# Patient Record
Sex: Female | Born: 1970 | Race: White | Hispanic: No | Marital: Married | Smoking: Former smoker
Health system: Southern US, Community
[De-identification: ages and names within clinical notes are randomized; demographics above are authoritative.]

## PROBLEM LIST (undated history)

## (undated) ENCOUNTER — Ambulatory Visit (HOSPITAL_COMMUNITY)

## (undated) DIAGNOSIS — M2550 Pain in unspecified joint: Secondary | ICD-10-CM

## (undated) DIAGNOSIS — E0590 Thyrotoxicosis, unspecified without thyrotoxic crisis or storm: Secondary | ICD-10-CM

## (undated) DIAGNOSIS — T7840XA Allergy, unspecified, initial encounter: Secondary | ICD-10-CM

## (undated) DIAGNOSIS — M1990 Unspecified osteoarthritis, unspecified site: Secondary | ICD-10-CM

## (undated) DIAGNOSIS — E079 Disorder of thyroid, unspecified: Secondary | ICD-10-CM

## (undated) DIAGNOSIS — E0500 Thyrotoxicosis with diffuse goiter without thyrotoxic crisis or storm: Secondary | ICD-10-CM

## (undated) HISTORY — DX: Disorder of thyroid, unspecified: E07.9

## (undated) HISTORY — PX: OTHER SURGICAL HISTORY: SHX169

## (undated) HISTORY — PX: SHOULDER SURGERY: SHX246

## (undated) HISTORY — DX: Allergy, unspecified, initial encounter: T78.40XA

## (undated) HISTORY — DX: Thyrotoxicosis with diffuse goiter without thyrotoxic crisis or storm: E05.00

## (undated) HISTORY — DX: Unspecified osteoarthritis, unspecified site: M19.90

---

## 2010-05-03 LAB — HM MAMMOGRAPHY

## 2012-08-18 DIAGNOSIS — M5137 Other intervertebral disc degeneration, lumbosacral region: Secondary | ICD-10-CM | POA: Insufficient documentation

## 2016-04-25 DIAGNOSIS — Z0000 Encounter for general adult medical examination without abnormal findings: Secondary | ICD-10-CM

## 2016-04-26 ENCOUNTER — Inpatient Hospital Stay: Admit: 2016-04-26 | Payer: BLUE CROSS/BLUE SHIELD | Primary: Family Medicine

## 2016-04-26 LAB — METABOLIC PANEL, COMPREHENSIVE
ALT (SGPT): 25 U/L (ref 12–78)
AST (SGOT): 16 U/L (ref 15–37)
Albumin: 3.8 gm/dl (ref 3.4–5.0)
Alk. phosphatase: 63 U/L (ref 45–117)
BUN: 16 mg/dl (ref 7–25)
Bilirubin, total: 0.4 mg/dl (ref 0.2–1.0)
CO2: 22 mEq/L (ref 21–32)
Calcium: 8.9 mg/dl (ref 8.5–10.1)
Chloride: 106 mEq/L (ref 98–107)
Creatinine: 0.7 mg/dl (ref 0.6–1.3)
GFR est AA: >60
GFR est non-AA: >60
Glucose: 87 mg/dl (ref 74–106)
Potassium: 3.9 mEq/L (ref 3.5–5.1)
Protein, total: 6.7 gm/dl (ref 6.4–8.2)
Sodium: 138 mEq/L (ref 136–145)

## 2016-04-26 LAB — CBC WITH AUTOMATED DIFF
BASOPHILS: 0.5 % (ref 0–3)
EOSINOPHILS: 1.4 % (ref 0–5)
HCT: 40.4 % (ref 37.0–50.0)
HGB: 13.5 gm/dl (ref 13.0–17.2)
IMMATURE GRANULOCYTES: 0.2 % (ref 0.0–3.0)
LYMPHOCYTES: 21.4 % — ABNORMAL LOW (ref 28–48)
MCH: 29 pg (ref 25.4–34.6)
MCHC: 33.4 gm/dl (ref 30.0–36.0)
MCV: 86.7 fL (ref 80.0–98.0)
MONOCYTES: 8 % (ref 1–13)
MPV: 10.6 fL — ABNORMAL HIGH (ref 6.0–10.0)
NEUTROPHILS: 68.5 % — ABNORMAL HIGH (ref 34–64)
NRBC: 0 (ref 0–0)
PLATELET: 289 10*3/uL (ref 140–450)
RBC: 4.66 M/uL (ref 3.60–5.20)
RDW-SD: 38.8 (ref 36.4–46.3)
WBC: 6.5 10*3/uL (ref 4.0–11.0)

## 2016-04-26 LAB — LIPID PANEL
CHOL/HDL Ratio: 4.8 Ratio — ABNORMAL HIGH (ref 0.0–4.4)
Cholesterol, total: 190 mg/dl (ref 140–199)
HDL Cholesterol: 40 mg/dl (ref 40–96)
LDL, calculated: 126 mg/dl (ref 0–130)
Triglyceride: 122 mg/dl (ref 29–150)

## 2016-04-26 LAB — TSH 3RD GENERATION: TSH: 1.63 u[IU]/mL (ref 0.358–3.740)

## 2016-04-26 LAB — VITAMIN D, 25 HYDROXY: Vitamin D, 25-OH, Total: 58.3 ng/ml (ref 30.0–100.0)

## 2016-04-26 LAB — VITAMIN B12: Vitamin B12: 518 pg/ml (ref 193–986)

## 2016-04-26 LAB — T4, FREE: Free T4: 1.33 ng/dl (ref 0.76–1.46)

## 2016-05-12 ENCOUNTER — Inpatient Hospital Stay
Admit: 2016-05-12 | Discharge: 2016-05-13 | Disposition: A | Discharge status: Home / Self Care | Payer: BLUE CROSS/BLUE SHIELD | Attending: Emergency Medicine

## 2016-05-12 DIAGNOSIS — H1013 Acute atopic conjunctivitis, bilateral: Secondary | ICD-10-CM

## 2016-05-12 MED ORDER — FAMOTIDINE (PF) 20 MG/2 ML IV
20 mg/2 mL | Freq: Two times a day (BID) | INTRAVENOUS | Status: DC
Start: 2016-05-12 — End: 2016-05-13
  Administered 2016-05-13: 08:00:00 via INTRAVENOUS

## 2016-05-12 MED ORDER — METHYLPREDNISOLONE (PF) 125 MG/2 ML IJ SOLR
125 mg/2 mL | Freq: Four times a day (QID) | INTRAMUSCULAR | Status: DC
Start: 2016-05-12 — End: 2016-05-13
  Administered 2016-05-12 – 2016-05-13 (×5): via INTRAVENOUS

## 2016-05-12 MED ORDER — SODIUM CHLORIDE 0.9 % IJ SYRG
Freq: Once | INTRAMUSCULAR | Status: AC
Start: 2016-05-12 — End: 2016-05-12
  Administered 2016-05-12: 20:00:00 via INTRAVENOUS

## 2016-05-12 MED ORDER — SODIUM CHLORIDE 0.9 % IJ SYRG
INTRAMUSCULAR | Status: DC | PRN
Start: 2016-05-12 — End: 2016-05-13

## 2016-05-12 MED ORDER — PROPARACAINE 0.5 % EYE DROPS
0.5 % | OPHTHALMIC | Status: AC
Start: 2016-05-12 — End: 2016-05-12
  Administered 2016-05-12: 22:00:00

## 2016-05-12 MED ORDER — FAMOTIDINE (PF) 20 MG/2 ML IV
20 mg/2 mL | INTRAVENOUS | Status: AC
Start: 2016-05-12 — End: 2016-05-12
  Administered 2016-05-12: 20:00:00 via INTRAVENOUS

## 2016-05-12 MED ORDER — SODIUM CHLORIDE 0.9 % IJ SYRG
Freq: Three times a day (TID) | INTRAMUSCULAR | Status: DC
Start: 2016-05-12 — End: 2016-05-13
  Administered 2016-05-13: 02:00:00 via INTRAVENOUS

## 2016-05-12 MED ORDER — FAMOTIDINE (PF) 20 MG/2 ML IV
20 mg/2 mL | Freq: Two times a day (BID) | INTRAVENOUS | Status: DC
Start: 2016-05-12 — End: 2016-05-12

## 2016-05-12 MED ORDER — DIPHENHYDRAMINE HCL 50 MG/ML IJ SOLN
50 mg/mL | INTRAMUSCULAR | Status: AC
Start: 2016-05-12 — End: 2016-05-12
  Administered 2016-05-12: 20:00:00 via INTRAVENOUS

## 2016-05-12 MED ORDER — DIPHENHYDRAMINE HCL 50 MG/ML IJ SOLN
50 mg/mL | Freq: Four times a day (QID) | INTRAMUSCULAR | Status: DC
Start: 2016-05-12 — End: 2016-05-13
  Administered 2016-05-13 (×3): via INTRAVENOUS

## 2016-05-12 MED FILL — DIPHENHYDRAMINE HCL 50 MG/ML IJ SOLN: 50 mg/mL | INTRAMUSCULAR | Qty: 1

## 2016-05-12 MED FILL — SODIUM CHLORIDE 0.9 % INJECTION: INTRAMUSCULAR | Qty: 10

## 2016-05-12 MED FILL — SOLU-MEDROL (PF) 125 MG/2 ML SOLUTION FOR INJECTION: 125 mg/2 mL | INTRAMUSCULAR | Qty: 2

## 2016-05-12 MED FILL — PROPARACAINE 0.5 % EYE DROPS: 0.5 % | OPHTHALMIC | Qty: 15

## 2016-05-12 NOTE — ED Notes (Signed)
TRANSFER - OUT REPORT:    Verbal report given to Toniann FailWendy, Charity fundraiserN (name) on Wanda Marshall  being transferred to Ed Obs, bed 3 (unit) for routine progression of care       Report consisted of patient???s Situation, Background, Assessment and   Recommendations(SBAR).     Information from the following report(s) SBAR was reviewed with the receiving nurse.    Lines:   Peripheral IV 05/12/16 Right Antecubital (Active)   Site Assessment Clean, dry, & intact 05/12/2016  2:35 PM   Phlebitis Assessment 0 05/12/2016  2:35 PM   Infiltration Assessment 0 05/12/2016  2:35 PM   Dressing Status Clean, dry, & intact 05/12/2016  2:35 PM   Dressing Type Transparent 05/12/2016  2:35 PM   Hub Color/Line Status Pink;Flushed;Patent 05/12/2016  2:35 PM   Action Taken Blood drawn 05/12/2016  2:35 PM   Alcohol Cap Used Yes 05/12/2016  2:35 PM        Opportunity for questions and clarification was provided.      Patient transported with:   [employer]

## 2016-05-12 NOTE — ED Notes (Signed)
Bedside and Verbal shift change report given to Chassidy A Mobley, RN   (oncoming nurse) by Hollie RN  (offgoing nurse). Report included the following information SBAR.

## 2016-05-12 NOTE — ED Notes (Signed)
TRANSFER - OUT REPORT:    Verbal report given to [facility], RN on [employer]ley Baskett.       Report consisted of patient???s Situation, Background, Assessment and   Recommendations(SBAR).     Information from the following report(s) SBAR, Kardex, ED Summary, [facility] and Recent Results was reviewed with the receiving nurse.    Lines:   Peripheral IV 05/12/16 Right Antecubital (Active)   Site Assessment Clean, dry, & intact 05/12/2016  2:35 PM   Phlebitis Assessment 0 05/12/2016  2:35 PM   Infiltration Assessment 0 05/12/2016  2:35 PM   Dressing Status Clean, dry, & intact 05/12/2016  2:35 PM   Dressing Type Transparent 05/12/2016  2:35 PM   Hub Color/Line Status Pink;Flushed;Patent 05/12/2016  2:35 PM   Action Taken Blood drawn 05/12/2016  2:35 PM   Alcohol Cap Used Yes 05/12/2016  2:35 PM        Opportunity for questions and clarification was provided.

## 2016-05-12 NOTE — ED Progress Note (Signed)
Tonopen:  Left eye: 16 with 5% SE  Right eye: 19 with 5% SE    Slit lamp evaluation performed and showed no abnormalities. No fluoroscein uptake. Patient is feeling better after receiving Proparacaine. Will continue to monitor and reassess in am.    Patient Vitals for the past 24 hrs:   Temp Pulse Resp BP SpO2   05/12/16 1700 98 ??F (36.7 ??C) (!) 107 16 122/81 100 %   05/12/16 1315 98.2 ??F (36.8 ??C) (!) 106 15 124/82 99 %       Carmelina DaneKara M Emori Kamau, PA-C

## 2016-05-12 NOTE — ED Notes (Signed)
Dr. Thana AtesMcCormack consulting at pt's bedside

## 2016-05-12 NOTE — ED Notes (Signed)
This RN, dietary, and the patient discussed options for dinner tray that would accommodate the patient's allergies.  Patient received dinner tray as requested, tolerated without distress.  Patient was not able to drink cola beverage provided by this RN due to ingredients.  Patient requested directions to vending machine and was taken to vending machine and also shown to hospital cafeteria.

## 2016-05-12 NOTE — ED Triage Notes (Signed)
Pt sent to ED by doctor for allergy flair up, complains of increased eye watering, photosensitivity etc starting yesterday morning.  Pt has been taking flonase, singulair, claritin and benadryl with no relief.  Doctor gave her 120mg  solumedrol in office but it did not work like they needed it to.

## 2016-05-12 NOTE — ED Notes (Signed)
Report attempted and notified by [facility], RN that they just took report on two patients and she will call me back when they get caught up.  Will follow up

## 2016-05-12 NOTE — ED Notes (Signed)
Pt laying in bed with eyes closed and no distress noted at this time.  Will monitor.

## 2016-05-12 NOTE — ED Notes (Signed)
Patient requested to know if the linen in the hospital is washed in detergent containing soy.  This RN spoke to BromideRachel, [employer] Supervisor, who communicated with head of department responsible for linen.  The supervisor stated that the company that washes the hospital linen is closed at this time and the only thing they can be certain of regarding laundry detergent is that it does not contain bleach.  This information was provided to the patient.  The patient accepted a top sheet and blanket but is leaving it at the foot of her bed until she needs them.  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  The patient states if there is soy in the detergent she will get hives that cause a burning sensation to her skin.  At this time the patient is in no acute distress related to the linen, will continue to monitor.

## 2016-05-12 NOTE — ED Notes (Signed)
TRANSFER - IN REPORT:    Verbal report received from MontaquaMegan, RN on Oscar Lashley Montagna  being received from ED for routine progression of care      Report consisted of patient???s Situation, Background, Assessment and   Recommendations(SBAR).     Information from the following report(s) SBAR, Kardex, ED Summary, [facility] and Recent Results was reviewed with the receiving nurse.    Opportunity for questions and clarification was provided.      Assessment completed upon patient???s arrival to unit and care assumed.

## 2016-05-12 NOTE — ED Provider Notes (Signed)
[facility]  Emergency Department Treatment Report    Patient: Wanda Marshall Age: 45 y.o. Sex: female    Date of Birth: 1971/04/30 Admit Date: 05/12/2016 PCP: Frederico Hamman, NP   MRN: [id number]  CSN: [id number]     Room: 103/EO03 Time Dictated: 2:02 PM          Chief Commplaint   Allergies    History of Present Illness   45 y.o. female who was referred by her primary care physician for what she is describing as seasonal allergies.  States she has a flareup like this about once a year. She is having watery eyes, that are irritated, the light is bothering them but she doesn't have eye pain or headache.  She felt like her eyes were puffy, She was given "steroid injections" at her pcp x 3 in 40mg  increments it was Solu-Medrol according to records. She states that she is feeling a big difference after the first shot and is improving but did not improve much after the subsequent injections.  Her nose was running a lot.  She is having mild wheezing off and on.  No angioedema.  No new foods or medicines.  She has been dealing with this "for a lot of years." Has gotten allergy shots before. Had just moved here but is in the process of being referred to another allergist.    She is taking singulair, claritin, flonase.  She took benadryl last yesterday 3 pills over the course of the day.  She has never been hospitalized except for an asthma attack 3 years ago.  She has an epi pen but has not needed to use it recently  She has no rash associated  She reports that after she got the Solu-Medrol her eyes were less puffy and she was feeling much better.    Review of Systems   Review of Systems   Constitutional: Negative for fever.   HENT: Positive for congestion. Negative for sore throat.         "sinus pressure"   Eyes: Negative for blurred vision.        No loss of vision denies any eye pain or pain with movement   Respiratory: Positive for wheezing. Negative for cough and shortness of breath.     Cardiovascular: Negative for chest pain and leg swelling.   Gastrointestinal: Negative for abdominal pain, diarrhea and vomiting.   Genitourinary: Negative for dysuria.   Musculoskeletal: Negative for falls.   Skin: Negative for itching and rash.   Neurological: Negative for loss of consciousness and headaches.       Past Medical/Surgical History     Past Medical History:   Diagnosis Date   ??? Asthma with allergic rhinitis    ??? Environmental allergies      Past Surgical History:   Procedure Laterality Date   ??? HX PARTIAL HYSTERECTOMY     ??? HX SHOULDER ARTHROSCOPY Bilateral      PMH: allergies, asthma, she has an "enlarged heart" is referred cardiology was seen on a cxr.  Shoulder arthritis  PSH: partial hysterectomy, 3 shoulder surgeries    Social History     Social History     Social History   ??? Marital status: MARRIED     Spouse name: N/A   ??? Number of children: N/A   ??? Years of education: N/A     Social History Main Topics   ??? Smoking status: Never Smoker   ??? Smokeless tobacco: Never Used   ???  Alcohol use No   ??? Drug use: No   ??? Sexual activity: Not Asked     Other Topics Concern   ??? None     Social History Narrative   ??? None     SH: no tobacco    Family History     Family History   Problem Relation Age of Onset   ??? Family history unknown: Yes       Current Medications   flonase singular claritin benadryl    None     Allergies   Bupivacaine  Beans    Allergies   Allergen Reactions   ??? Septocaine [Articaine-Epinephrine] Anaphylaxis   ??? Decadron [Dexamethasone] Other (comments)     Pressure in ears, rash   ??? Elavil Rash   ??? Prednisone Other (comments)     "makes me want to kill people"  And a rash   ??? Propylene Glycol Rash   ??? Sesame Seed Rash   ??? Soy Rash     Blisters in mouth       Physical Exam   ED Triage Vitals   Enc Vitals Group      BP 05/12/16 1315 124/82      Pulse (Heart Rate) 05/12/16 1315 106      Resp Rate 05/12/16 1315 15      Temp 05/12/16 1315 98.2 ??F (36.8 ??C)      Temp src --        O2 Sat (%) 05/12/16 1315 99 %      Weight 05/12/16 1307 153 lb 2 oz      Height 05/12/16 1307 5' 3.25"      Head Cir --       Peak Flow --       Pain Score --       Pain Loc --       Pain Edu? --       Excl. in GC? --        Physical Exam   Constitutional: She is oriented to person, place, and time and well-developed, well-nourished, and in no distress. No distress.   HENT:   Head: Normocephalic and atraumatic.   Right Ear: External ear normal.   Left Ear: External ear normal.   Mouth/Throat: Oropharynx is clear and moist.   No angioedema  Nose has some congestion and rhinorrhea and indicates she has some maxillary sinus pressure   Eyes: EOM are normal. Pupils are equal, round, and reactive to light.   No pain with EOM  She has slightly puffy but not cellulitic upper lids  She has a slight injection to the left conjunctiva which otherwise looks okay  Right conjunctiva is normal   Neck: Neck supple.   Cardiovascular: Normal rate, regular rhythm and normal heart sounds.    Pulmonary/Chest: Effort normal and breath sounds normal. She has no wheezes.   Abdominal: Soft. There is no tenderness.   Musculoskeletal: Normal range of motion.   Neurological: She is alert and oriented to person, place, and time.   No facial droop   Skin: Skin is warm and dry.   Psychiatric: Affect normal.       Impression and Management Plan   Patient is here with the complaint of she states her seasonal allergies are bothering her. She has no wheezing or angioedema. She has no rash. She had been sent here by her primary care physician for IV steroids. She states that she had a lot of improvement with the steroids  and with her history of allergies and constellation of symptoms runny eyes and runny nose I suspect this is allergic in nature. I don't think this is any type of cellulitis and she doesn't have a headache.    I've asked Diannia RuderKara DeMott to do a slit lamp exam to r/o an abrasion or ulcer and check pressures    Diagnostic Studies      ED Course/Medical Decision Making   Patient did not develop any new symptoms I have spoken to pharmacist as to who advises we can proceed with IV Solu-Medrol 6 hours from time of last IM Solu-Medrol which would be about 5:15. I gave the patient IV Benadryl and Pepcid. She was sent here from her primary care physician for observation and continued steroids. So I placed her in ED observation for such. She'll continue to get IV Benadryl and Pepcid.  I will continue her Claritin Singulair and Flonase    Visual acuity bilateral 20/13 right 20/13 left 20/13  Patient denies any loss of vision or double vision or floaters or flashers  States she wears contacts but does not have them in today    Eye exam by  [place] CenterKara DeMott showed no ulcer, abrasion, uptake of flouroscein  She was dispo to ED OBS for continued eval and treatment      Medications   sodium chloride (NS) flush 5-10 mL (not administered)   sodium chloride (NS) flush 5-10 mL (not administered)   methylPREDNISolone (PF) (SOLU-MEDROL) injection 60 mg (60 mg IntraVENous Given 05/12/16 1707)   diphenhydrAMINE (BENADRYL) injection 25 mg (0 mg IntraVENous Held 05/12/16 1800)   famotidine (PF) (PEPCID) injection 20 mg (not administered)   fluticasone (FLONASE) 50 mcg/actuation nasal spray 2 Spray (not administered)   loratadine (CLARITIN) tablet 10 mg (not administered)   montelukast (SINGULAIR) tablet 10 mg (not administered)   sodium chloride (NS) flush 5-10 mL (10 mL IntraVENous Given 05/12/16 1540)   diphenhydrAMINE (BENADRYL) injection 25 mg (25 mg IntraVENous Given 05/12/16 1540)   famotidine (PF) (PEPCID) 20 mg in sodium chloride 0.9 % 10 mL injection (20 mg IntraVENous Given 05/12/16 1540)   proparacaine (OPTHAINE) 0.5 % ophthalmic solution (  Given by Provider 05/12/16 1800)       Final Diagnosis       ICD-10-CM ICD-9-CM   1. Allergic conjunctivitis, bilateral H10.13 372.14   2. Rhinorrhea J34.89 478.19       Disposition   ED OBS    Elveria RoyalsSara N Khambrel Amsden, MD  May 12, 2016     My signature above authenticates this document and my orders, the final ??  diagnosis (es), discharge prescription (s), and instructions in the Epic ??  record.  If you have any questions please contact [phone].  ??  Nursing notes have been reviewed by the physician/ advanced practice ??  Clinician.

## 2016-05-13 MED ORDER — METHYLPREDNISOLONE 4 MG TABS IN A DOSE PACK
4 mg | ORAL | 0 refills | Status: AC
Start: 2016-05-13 — End: ?

## 2016-05-13 MED ORDER — LORATADINE 10 MG TAB
10 mg | Freq: Every day | ORAL | Status: DC
Start: 2016-05-13 — End: 2016-05-13
  Administered 2016-05-13: 12:00:00 via ORAL

## 2016-05-13 MED ORDER — ERYTHROMYCIN 5 MG/G EYE OINTMENT
5 mg/gram (0. %) | OPHTHALMIC | 0 refills | Status: AC
Start: 2016-05-13 — End: 2016-05-20

## 2016-05-13 MED ORDER — FLUTICASONE 50 MCG/ACTUATION NASAL SPRAY, SUSP
50 mcg/actuation | Freq: Every day | NASAL | Status: DC
Start: 2016-05-13 — End: 2016-05-13
  Administered 2016-05-13: 12:00:00 via NASAL

## 2016-05-13 MED ORDER — MONTELUKAST 10 MG TAB
10 mg | Freq: Every day | ORAL | Status: DC
Start: 2016-05-13 — End: 2016-05-13
  Administered 2016-05-13: 15:00:00 via ORAL

## 2016-05-13 MED ORDER — SODIUM CHLORIDE 0.9 % IV
Freq: Once | INTRAVENOUS | Status: AC
Start: 2016-05-13 — End: 2016-05-13
  Administered 2016-05-13: 08:00:00 via INTRAVENOUS

## 2016-05-13 MED FILL — SODIUM CHLORIDE 0.9 % IV: INTRAVENOUS | Qty: 1000

## 2016-05-13 MED FILL — MONTELUKAST 10 MG TAB: 10 mg | ORAL | Qty: 1

## 2016-05-13 MED FILL — SOLU-MEDROL (PF) 125 MG/2 ML SOLUTION FOR INJECTION: 125 mg/2 mL | INTRAMUSCULAR | Qty: 2

## 2016-05-13 MED FILL — DIPHENHYDRAMINE HCL 50 MG/ML IJ SOLN: 50 mg/mL | INTRAMUSCULAR | Qty: 1

## 2016-05-13 MED FILL — FAMOTIDINE (PF) 20 MG/2 ML IV: 20 mg/2 mL | INTRAVENOUS | Qty: 2

## 2016-05-13 MED FILL — LORATADINE 10 MG TAB: 10 mg | ORAL | Qty: 1

## 2016-05-13 MED FILL — FLUTICASONE 50 MCG/ACTUATION NASAL SPRAY, SUSP: 50 mcg/actuation | NASAL | Qty: 16

## 2016-05-13 NOTE — Discharge Summary (Signed)
Discharge Summary by Julien GirtIcenbice, Anatole Apollo E, PA-C at 05/13/16 1018                Author: Julien GirtIcenbice, Taiten Brawn E, PA-C  Service: Emergency Medicine  Author Type: Physician Assistant       Filed: 05/13/16 1101  Date of Service: 05/13/16 1018  Status: Attested           Editor: Meribeth MattesIcenbice, Leah Thornberry E, PA-C (Physician Assistant)  Cosigner: Jerilynn SomHsu, Helen H, MD at 05/13/16 1659          Attestation signed by Jerilynn SomHsu, Helen H, MD at 05/13/16 1659          I, Dianna RossettiHelen Hsu MD, have discussed this case with the Advanced Practice Provider.  We have reviwed the presentation, pertinent historical and physical exam findings,  as well as relevant laboratory/radiology finding.  I have been available at all times and agree with the managment and disposition documented here.      Dianna RossettiHelen Hsu, MD                                 [facility]   Emergency ObservationDepartment    Discharge Summary          Patient: Wanda Marshall  Age: 45 y.o.  Sex: female          Date of Birth: 1971-09-07  Admit Date: 05/12/2016  PCP: Frederico HammanKathleen B Erezo, NP     MRN: [id number]   CSN: [id number]            Room: 103/EO03            Discharge Physician    Dr. Dianna RossettiHelen Hsu   Date & Time of Observation Admission:   May 12, 2016 4:15 PM   Date & Time of Observation Discharge:   May 13, 2016 10:18 AM        History of Present Illness     45 y.o. female  came in with severe swelling and redness to face thought to be from allergies which she had had severely before.  Placed in observation for IV steroids, benadryl, pepcid, re-evaluation.        ED Observation Course          Medications       sodium chloride (NS) flush 5-10 mL (10 mL IntraVENous Given 05/12/16 2136)     sodium chloride (NS) flush 5-10 mL (not administered)     methylPREDNISolone (PF) (SOLU-MEDROL) injection 60 mg (60 mg IntraVENous Given 05/13/16 1032)     diphenhydrAMINE (BENADRYL) injection 25 mg (25 mg IntraVENous Given 05/13/16 1031)     famotidine (PF) (PEPCID) injection 20 mg (20 mg IntraVENous  Given 05/13/16 0413)     fluticasone (FLONASE) 50 mcg/actuation nasal spray 2 Spray (2 Sprays Both Nostrils Given 05/13/16 0815)     loratadine (CLARITIN) tablet 10 mg (10 mg Oral Given 05/13/16 0812)     montelukast (SINGULAIR) tablet 10 mg (10 mg Oral Given 05/13/16 1034)     sodium chloride (NS) flush 5-10 mL (10 mL IntraVENous Given 05/12/16 1540)     diphenhydrAMINE (BENADRYL) injection 25 mg (25 mg IntraVENous Given 05/12/16 1540)     famotidine (PF) (PEPCID) 20 mg in sodium chloride 0.9 % 10 mL injection (20 mg IntraVENous Given 05/12/16 1540)     proparacaine (OPTHAINE) 0.5 % ophthalmic solution (  Given by Provider 05/12/16 1800)  0.9% sodium chloride infusion (0 mL/hr IntraVENous IV Completed 05/13/16 1037)        Patient had gradual and steady improvement in her swelling and redness.  No fevers developed.  Minor discharge developed from left eye. No wheezing, dyspnea or trouble speaking/swallowing developed.      Patient felt better this morning and asked to be discharged after her 10am dose of medications.      Left eye was mildly red compared to right and had discharge this AM per patient (none now).  Patient has recently been exposed to conjunctivitis at work.  Discussed this is more likely allergic but due to unilateral involvement and recent exposure will  rx antibiotic ointment (patient states not allergic).  States she can tolerate methylprenisolone to go home on.  Will continue benadryl and pepcid at home and f/u close with PCP.        Physical Exam          Visit Vitals         ?  BP  108/62 (BP 1 Location: Right arm, BP Patient Position: Supine)     ?  Pulse  91     ?  Temp  98.3 ??F (36.8 ??C)     ?  Resp  18     ?  Ht  5' 3.25" (1.607 m)         ?  Wt  69.5 kg (153 lb 2 oz)         ?  SpO2  98%         ?  BMI  26.91 kg/m2            Physical Exam    Constitutional: She is well-developed, well-nourished, and in no distress.    Eyes: EOM are normal. Pupils are equal, round, and reactive to light.  Right eye exhibits no chemosis, no discharge and no exudate. Left eye exhibits no chemosis, no discharge and no exudate. Right conjunctiva is not injected. Right conjunctiva has no  hemorrhage. Left conjunctiva is injected. Left conjunctiva has no hemorrhage.    No edema, redness or swelling around the eyes or to the face    Neck: Normal range of motion. Neck supple. No edema present.    Pulmonary/Chest: Breath sounds normal. She has no wheezes.         Clinical Impression/diagnosis                 ICD-10-CM  ICD-9-CM          1.  Allergic conjunctivitis, bilateral  H10.13  372.14          2.  Rhinorrhea  J34.89  478.19             Disposition and Plan          Follow-up Information        Follow up With  Details  Comments  Contact Info             Frederico Hamman, NP  Schedule an appointment as soon as possible for a visit  For follow-up of allergies/swelling  [address]   [place] [address]   [phone]                [facility] EMERGENCY DEPT  Go to  If symptoms worsen  [address] [place] [address]   [phone]             Rx for Medrol  dose pack which patient states she isn't allergic to   Advised OTC Pepcid/Benadryl      The patient was personally evaluated by myself and Dr. Raynald Kemp who agrees with the above assessment and plan.         Julien Girt, PA-C   May 13, 2016      [facility] medical dictation software was used for portions of this report. Unintended voice recognition errors may occur.

## 2016-05-13 NOTE — Discharge Summary (Signed)
[facility]  Emergency ObservationDepartment   Discharge Summary    Patient: Wanda Marshall Age: 45 y.o. Sex: female    Date of Birth: 1971-01-18 Admit Date: 05/12/2016 PCP: Frederico HammanKathleen B Erezo, NP   MRN: [id number]  CSN: [id number]     Room: 103/EO03       Discharge Physician   Dr. Dianna RossettiHelen Hsu  Date & Time of Observation Admission:  May 12, 2016 4:15 PM  Date & Time of Observation Discharge:  May 13, 2016 10:18 AM    History of Present Illness   45 y.o. female came in with severe swelling and redness to face thought to be from allergies which she had had severely before.  Placed in observation for IV steroids, benadryl, pepcid, re-evaluation.    ED Observation Course     Medications   sodium chloride (NS) flush 5-10 mL (10 mL IntraVENous Given 05/12/16 2136)   sodium chloride (NS) flush 5-10 mL (not administered)   methylPREDNISolone (PF) (SOLU-MEDROL) injection 60 mg (60 mg IntraVENous Given 05/13/16 1032)   diphenhydrAMINE (BENADRYL) injection 25 mg (25 mg IntraVENous Given 05/13/16 1031)   famotidine (PF) (PEPCID) injection 20 mg (20 mg IntraVENous Given 05/13/16 0413)   fluticasone (FLONASE) 50 mcg/actuation nasal spray 2 Spray (2 Sprays Both Nostrils Given 05/13/16 0815)   loratadine (CLARITIN) tablet 10 mg (10 mg Oral Given 05/13/16 0812)   montelukast (SINGULAIR) tablet 10 mg (10 mg Oral Given 05/13/16 1034)   sodium chloride (NS) flush 5-10 mL (10 mL IntraVENous Given 05/12/16 1540)   diphenhydrAMINE (BENADRYL) injection 25 mg (25 mg IntraVENous Given 05/12/16 1540)   famotidine (PF) (PEPCID) 20 mg in sodium chloride 0.9 % 10 mL injection (20 mg IntraVENous Given 05/12/16 1540)   proparacaine (OPTHAINE) 0.5 % ophthalmic solution (  Given by Provider 05/12/16 1800)   0.9% sodium chloride infusion (0 mL/hr IntraVENous IV Completed 05/13/16 1037)     Patient had gradual and steady improvement in her swelling and redness.  No fevers developed.  Minor discharge developed from left eye. No  wheezing, dyspnea or trouble speaking/swallowing developed.    Patient felt better this morning and asked to be discharged after her 10am dose of medications.    Left eye was mildly red compared to right and had discharge this AM per patient (none now).  Patient has recently been exposed to conjunctivitis at work.  Discussed this is more likely allergic but due to unilateral involvement and recent exposure will rx antibiotic ointment (patient states not allergic).  States she can tolerate methylprenisolone to go home on.  Will continue benadryl and pepcid at home and f/u close with PCP.    Physical Exam     Visit Vitals   ??? BP 108/62 (BP 1 Location: Right arm, BP Patient Position: Supine)   ??? Pulse 91   ??? Temp 98.3 ??F (36.8 ??C)   ??? Resp 18   ??? Ht 5' 3.25" (1.607 m)   ??? Wt 69.5 kg (153 lb 2 oz)   ??? SpO2 98%   ??? BMI 26.91 kg/m2        Physical Exam   Constitutional: She is well-developed, well-nourished, and in no distress.   Eyes: EOM are normal. Pupils are equal, round, and reactive to light. Right eye exhibits no chemosis, no discharge and no exudate. Left eye exhibits no chemosis, no discharge and no exudate. Right conjunctiva is not injected. Right conjunctiva has no hemorrhage. Left conjunctiva is injected. Left conjunctiva has no hemorrhage.  No edema, redness or swelling around the eyes or to the face   Neck: Normal range of motion. Neck supple. No edema present.   Pulmonary/Chest: Breath sounds normal. She has no wheezes.     Clinical Impression/diagnosis       ICD-10-CM ICD-9-CM   1. Allergic conjunctivitis, bilateral H10.13 372.14   2. Rhinorrhea J34.89 478.19       Disposition and Plan     Follow-up Information     Follow up With Details Comments Contact Info    Frederico Hamman, NP Schedule an appointment as soon as possible for a visit For follow-up of allergies/swelling [address] [place]  [place] [address]  [phone]      [facility] EMERGENCY DEPT Go to If symptoms worsen [address] [place] [address]  [phone]        Rx for Medrol dose pack which patient states she isn't allergic to  Advised OTC Pepcid/Benadryl    The patient was personally evaluated by myself and Dr. Raynald Kemp who agrees with the above assessment and plan.      Julien Girt, PA-C  May 13, 2016    [facility] medical dictation software was used for portions of this report. Unintended voice recognition errors may occur.

## 2016-05-13 NOTE — ED Notes (Signed)
11:56 AM  05/13/16     Discharge instructions given to patient, Wanda Marshall, with verbalization of understanding. Patient accompanied by her husband.  Patient discharged with the following prescriptions:  Medrol dose pak, erythromycin ointment.  Patient discharged to home/self-care.      Donald SivaWendy L Keyshla Tunison, RN

## 2016-05-13 NOTE — ED Progress Note (Deleted)
[facility]  Emergency Department Observation Unit      Observation Progress Note   Patient is being seen by care management re: oxygen.    Patient is visibly upset- she is experiencing some acute anxiety/grief response about what happened yesterday.    Psych consult placed in house for help.  Renato Gailsastor will be coming to see the patient called by RN.  Patient will accept something for acute anxiety- ordered 0.5mg  Ativan IV.    No acute wheezing, sats 100% on her 2L.    Visit Vitals   ??? BP 108/62 (BP 1 Location: Right arm, BP Patient Position: Supine)   ??? Pulse 91   ??? Temp 98.3 ??F (36.8 ??C)   ??? Resp 18   ??? Ht 5' 3.25" (1.607 m)   ??? Wt 69.5 kg (153 lb 2 oz)   ??? SpO2 98%   ??? BMI 26.91 kg/m2           Meribeth Mattesrin E Caelan Branden, PA-C  May 13, 2016

## 2016-05-13 NOTE — ED Progress Note (Signed)
No complaints and resting comfortably. Continue to monitor and reassess in the morning. She has become slightly more tachycardic, IV maintenance fluids ordered. Otherwise vital signs stable.      Patient Vitals for the past 24 hrs:   Temp Pulse Resp BP SpO2   05/12/16 2123 98 ??F (36.7 ??C) (!) 119 18 132/81 100 %   05/12/16 1700 98 ??F (36.7 ??C) (!) 107 16 122/81 100 %   05/12/16 1315 98.2 ??F (36.8 ??C) (!) 106 15 124/82 99 %

## 2016-05-13 NOTE — ED Notes (Signed)
TRANSFER - IN REPORT:    Verbal report received from [place]hass[place], RN on Wanda Marshall.      Report consisted of patient???s Situation, Background, Assessment and   Recommendations(SBAR).     Information from the following report(s) SBAR, Kardex, ED Summary, [facility] and Recent Results was reviewed with the receiving nurse.    Opportunity for questions and clarification was provided.      Assessment completed upon patient???s care assumed.

## 2017-05-29 ENCOUNTER — Inpatient Hospital Stay: Admit: 2017-05-29 | Payer: BLUE CROSS/BLUE SHIELD | Primary: Family Medicine

## 2017-05-29 DIAGNOSIS — Z0000 Encounter for general adult medical examination without abnormal findings: Secondary | ICD-10-CM

## 2017-05-29 LAB — METABOLIC PANEL, COMPREHENSIVE
ALT (SGPT): 31 U/L (ref 12–78)
AST (SGOT): 22 U/L (ref 15–37)
Albumin: 4.1 gm/dl (ref 3.4–5.0)
Alk. phosphatase: 76 U/L (ref 45–117)
Anion gap: 8 mmol/L (ref 5–15)
BUN: 13 mg/dl (ref 7–25)
Bilirubin, total: 0.8 mg/dl (ref 0.2–1.0)
CO2: 26 mEq/L (ref 21–32)
Calcium: 8.8 mg/dl (ref 8.5–10.1)
Chloride: 105 mEq/L (ref 98–107)
Creatinine: 0.8 mg/dl (ref 0.6–1.3)
GFR est AA: >60
GFR est non-AA: >60
Glucose: 97 mg/dl (ref 74–106)
Potassium: 3.8 mEq/L (ref 3.5–5.1)
Protein, total: 7.2 gm/dl (ref 6.4–8.2)
Sodium: 139 mEq/L (ref 136–145)

## 2017-05-29 LAB — HEMOGLOBIN A1C W/O EAG: Hemoglobin A1c: 5.3 % (ref 4.2–6.3)

## 2017-05-29 LAB — TSH 3RD GENERATION: TSH: 1.29 u[IU]/mL (ref 0.358–3.740)

## 2017-05-29 LAB — CBC WITH AUTOMATED DIFF
BASOPHILS: 0.6 % (ref 0–3)
EOSINOPHILS: 1.6 % (ref 0–5)
HCT: 41.9 % (ref 37.0–50.0)
HGB: 14 gm/dl (ref 13.0–17.2)
IMMATURE GRANULOCYTES: 0.4 % (ref 0.0–3.0)
LYMPHOCYTES: 26.7 % — ABNORMAL LOW (ref 28–48)
MCH: 28.8 pg (ref 25.4–34.6)
MCHC: 33.4 gm/dl (ref 30.0–36.0)
MCV: 86.2 fL (ref 80.0–98.0)
MONOCYTES: 7 % (ref 1–13)
MPV: 10.2 fL — ABNORMAL HIGH (ref 6.0–10.0)
NEUTROPHILS: 63.7 % (ref 34–64)
NRBC: 0 (ref 0–0)
PLATELET: 313 10*3/uL (ref 140–450)
RBC: 4.86 M/uL (ref 3.60–5.20)
RDW-SD: 37.9 (ref 36.4–46.3)
WBC: 5.1 10*3/uL (ref 4.0–11.0)

## 2017-05-29 LAB — LIPID PANEL
CHOL/HDL Ratio: 5.4 Ratio — ABNORMAL HIGH (ref 0.0–4.4)
Cholesterol, total: 206 mg/dl — ABNORMAL HIGH (ref 140–199)
HDL Cholesterol: 38 mg/dl — ABNORMAL LOW (ref 40–96)
LDL, calculated: 137 mg/dl — ABNORMAL HIGH (ref 0–130)
Triglyceride: 154 mg/dl — ABNORMAL HIGH (ref 29–150)

## 2017-05-29 LAB — T4, FREE: Free T4: 1.49 ng/dl — ABNORMAL HIGH (ref 0.76–1.46)

## 2017-05-29 LAB — FERRITIN: Ferritin: 22.6 ng/ml (ref 8.0–252.0)

## 2017-05-29 LAB — VITAMIN B12: Vitamin B12: 471 pg/ml (ref 193–986)

## 2017-05-29 LAB — VITAMIN D, 25 HYDROXY: Vitamin D, 25-OH, Total: 44.9 ng/ml (ref 30.0–100.0)

## 2018-01-03 ENCOUNTER — Encounter

## 2018-01-03 ENCOUNTER — Inpatient Hospital Stay: Admit: 2018-01-03 | Payer: BLUE CROSS/BLUE SHIELD | Attending: Rheumatology | Primary: Family Medicine

## 2018-01-03 DIAGNOSIS — M79672 Pain in left foot: Secondary | ICD-10-CM

## 2018-01-03 DIAGNOSIS — M2550 Pain in unspecified joint: Secondary | ICD-10-CM | POA: Diagnosis not present

## 2018-01-03 DIAGNOSIS — R5383 Other fatigue: Secondary | ICD-10-CM | POA: Diagnosis not present

## 2018-01-07 ENCOUNTER — Encounter

## 2018-01-14 ENCOUNTER — Inpatient Hospital Stay: Admit: 2018-01-14 | Payer: BLUE CROSS/BLUE SHIELD | Attending: Rheumatology | Primary: Family Medicine

## 2018-01-14 DIAGNOSIS — M13842 Other specified arthritis, left hand: Secondary | ICD-10-CM

## 2018-02-19 DIAGNOSIS — F17200 Nicotine dependence, unspecified, uncomplicated: Secondary | ICD-10-CM | POA: Diagnosis not present

## 2018-02-19 DIAGNOSIS — F419 Anxiety disorder, unspecified: Secondary | ICD-10-CM | POA: Diagnosis not present

## 2018-02-19 DIAGNOSIS — E785 Hyperlipidemia, unspecified: Secondary | ICD-10-CM | POA: Diagnosis not present

## 2018-02-19 DIAGNOSIS — R000 Tachycardia, unspecified: Secondary | ICD-10-CM | POA: Diagnosis not present

## 2018-02-19 DIAGNOSIS — N951 Menopausal and female climacteric states: Secondary | ICD-10-CM | POA: Diagnosis not present

## 2018-03-07 DIAGNOSIS — E785 Hyperlipidemia, unspecified: Secondary | ICD-10-CM | POA: Diagnosis not present

## 2018-03-18 ENCOUNTER — Encounter

## 2018-03-19 DIAGNOSIS — E0590 Thyrotoxicosis, unspecified without thyrotoxic crisis or storm: Secondary | ICD-10-CM | POA: Diagnosis not present

## 2018-03-25 DIAGNOSIS — F17210 Nicotine dependence, cigarettes, uncomplicated: Secondary | ICD-10-CM | POA: Diagnosis not present

## 2018-03-25 DIAGNOSIS — M2550 Pain in unspecified joint: Secondary | ICD-10-CM | POA: Diagnosis not present

## 2018-03-25 DIAGNOSIS — N951 Menopausal and female climacteric states: Secondary | ICD-10-CM | POA: Diagnosis not present

## 2018-03-25 DIAGNOSIS — F419 Anxiety disorder, unspecified: Secondary | ICD-10-CM | POA: Diagnosis not present

## 2018-07-10 DIAGNOSIS — E0590 Thyrotoxicosis, unspecified without thyrotoxic crisis or storm: Secondary | ICD-10-CM | POA: Diagnosis not present

## 2018-08-26 DIAGNOSIS — E0590 Thyrotoxicosis, unspecified without thyrotoxic crisis or storm: Secondary | ICD-10-CM | POA: Diagnosis not present

## 2018-08-26 DIAGNOSIS — M2550 Pain in unspecified joint: Secondary | ICD-10-CM | POA: Diagnosis not present

## 2018-08-26 DIAGNOSIS — E785 Hyperlipidemia, unspecified: Secondary | ICD-10-CM | POA: Diagnosis not present

## 2018-09-15 DIAGNOSIS — K5900 Constipation, unspecified: Secondary | ICD-10-CM | POA: Diagnosis not present

## 2018-09-15 DIAGNOSIS — Z0000 Encounter for general adult medical examination without abnormal findings: Secondary | ICD-10-CM | POA: Diagnosis not present

## 2018-09-15 DIAGNOSIS — E0590 Thyrotoxicosis, unspecified without thyrotoxic crisis or storm: Secondary | ICD-10-CM | POA: Diagnosis not present

## 2018-10-28 DIAGNOSIS — H1013 Acute atopic conjunctivitis, bilateral: Secondary | ICD-10-CM | POA: Diagnosis not present

## 2018-10-28 DIAGNOSIS — Z91018 Allergy to other foods: Secondary | ICD-10-CM | POA: Diagnosis not present

## 2018-10-28 DIAGNOSIS — H6980 Other specified disorders of Eustachian tube, unspecified ear: Secondary | ICD-10-CM | POA: Diagnosis not present

## 2018-10-28 DIAGNOSIS — R42 Dizziness and giddiness: Secondary | ICD-10-CM | POA: Diagnosis not present

## 2018-11-03 DIAGNOSIS — E0590 Thyrotoxicosis, unspecified without thyrotoxic crisis or storm: Secondary | ICD-10-CM | POA: Diagnosis not present

## 2018-11-03 DIAGNOSIS — M9904 Segmental and somatic dysfunction of sacral region: Secondary | ICD-10-CM | POA: Diagnosis not present

## 2018-11-03 DIAGNOSIS — M9901 Segmental and somatic dysfunction of cervical region: Secondary | ICD-10-CM | POA: Diagnosis not present

## 2018-11-03 DIAGNOSIS — M5386 Other specified dorsopathies, lumbar region: Secondary | ICD-10-CM | POA: Diagnosis not present

## 2018-11-03 DIAGNOSIS — M5382 Other specified dorsopathies, cervical region: Secondary | ICD-10-CM | POA: Diagnosis not present

## 2018-11-05 ENCOUNTER — Encounter: Payer: Self-pay | Admitting: Internal Medicine

## 2018-11-05 ENCOUNTER — Ambulatory Visit (INDEPENDENT_AMBULATORY_CARE_PROVIDER_SITE_OTHER): Payer: Federal, State, Local not specified - PPO | Admitting: Internal Medicine

## 2018-11-05 VITALS — BP 110/80 | HR 83 | Ht 63.25 in | Wt 159.0 lb

## 2018-11-05 DIAGNOSIS — E0500 Thyrotoxicosis with diffuse goiter without thyrotoxic crisis or storm: Secondary | ICD-10-CM | POA: Diagnosis not present

## 2018-11-05 DIAGNOSIS — E0590 Thyrotoxicosis, unspecified without thyrotoxic crisis or storm: Secondary | ICD-10-CM | POA: Insufficient documentation

## 2018-11-05 DIAGNOSIS — Z8639 Personal history of other endocrine, nutritional and metabolic disease: Secondary | ICD-10-CM | POA: Insufficient documentation

## 2018-11-05 DIAGNOSIS — Z923 Personal history of irradiation: Secondary | ICD-10-CM | POA: Diagnosis not present

## 2018-11-05 NOTE — Progress Notes (Signed)
Name: Melanie Travis  MRN/ DOB: [id number], 03/01/1971    Age/ Sex: 48 y.o., female    PCP: Daine Gravel   Reason for Endocrinology Evaluation: Hyperthyroidism     Date of Initial Endocrinology Evaluation: 11/05/2018     HPI: [name] is a 48 y.o. female with a past medical history of Lumbosacral DDD and  Asthma . The patient presented for initial endocrinology clinic visit on 11/05/2018 for consultative assistance with her hyperthyroidism.   Pt is accompanied by her spouse today Leonette Most)  She was seen by [facility] Endocrinology in May, 2019 for hyperthyroidism. She was having palpitations, weight loss and tremors for 6 months prior to her presentation. She had a TSH of 0.029 uIU/mL with elevated FT4 3.7 ug/dL    She had an Uptake and scan that showed diffusely hyperfunctioning thyroid gland due to graves' disease., 24-hr uptake was 78 % with elevated TSI and TRAb levels.   She quit smoking in May, 2019  No B-blockers due to asthma    She was started initially on Methimazole in May, 2019 , initially on 10 mg then increased to 15 mg daily, but when her TFT's continued to be overactive , she underwent RAI ablation.    She did have RAI on 07/31/18 with 15.2 mCi of 131- RAI   She did not notice any recent weight gain, she continues with heat intolerance, nausea and palpitations.   She has chronic eye irritation and noted decrease in vision for the past year.   She did notice decrease in neck size since the RAI ablation.    Moved from [place], [place] a month and half ago.  She works for the post [employer]    HISTORY:   Past Medical History: Asthma, Allergies, graves' disease. L-S DDD.  Past Surgical History:  Past Surgical History:  Procedure Laterality Date  . hystrectomy    . SHOULDER SURGERY        Social History:    Family History: family history includes Allergies in her father; Celiac disease in her mother.   HOME MEDICATIONS: Allergies as of  11/05/2018      Reactions   Tramadol       Medication List       Accurate as of November 05, 2018  8:19 AM. Always use your most recent med list.        loratadine 10 MG tablet Commonly known as:  CLARITIN Take 10 mg by mouth daily.         REVIEW OF SYSTEMS: A comprehensive ROS was conducted with the patient and is negative except as per HPI and below:  Review of Systems  Constitutional: Positive for malaise/fatigue. Negative for weight loss.  HENT: Positive for congestion.   Eyes: Positive for pain and discharge.  Respiratory: Positive for cough and shortness of breath.        Allergy related.   Cardiovascular: Positive for palpitations. Negative for chest pain.  Gastrointestinal: Negative for constipation and diarrhea.  Genitourinary: Negative for frequency.  Skin: Negative.   Neurological: Negative for tingling and tremors.  Endo/Heme/Allergies: Positive for polydipsia.  Psychiatric/Behavioral: Negative for depression. The patient is nervous/anxious.        OBJECTIVE:  VS: BP 110/80 (BP Location: Left Arm, Patient Position: Sitting, Cuff Size: Normal)   Pulse 83   Ht 5' 3.25" (1.607 m)   Wt 159 lb (72.1 kg)   BMI 27.94 kg/m    Wt Readings from Last 3 Encounters:  11/05/18 159 lb (72.1 kg)     EXAM: General: Pt appears well and is in NAD  Hydration: Well-hydrated with moist mucous membranes and good skin turgor  Eyes: External eye exam normal without stare, lid lag or exophthalmos.  EOM intact.  PERRL.  Ears, Nose, Throat: Hearing: Grossly intact bilaterally Dental: Good dentition  Throat: Clear without mass, erythema or exudate  Neck: General: Supple without adenopathy. Thyroid: Thyroid size normal.  No goiter or nodules appreciated. No thyroid bruit.  Lungs: Clear with good BS bilat with no rales, rhonchi, or wheezes  Heart: Auscultation: RRR.  Abdomen: Normoactive bowel sounds, soft, nontender, without masses or organomegaly palpable  Extremities:    BL LE: No pretibial edema normal ROM and strength.  Skin: Hair: Texture and amount normal with gender appropriate distribution Skin Inspection: No rashes. Skin Palpation: Skin temperature, texture, and thickness normal to palpation  Neuro: Cranial nerves: II - XII grossly intact  Motor: Normal strength throughout DTRs: 2+ and symmetric in UE without delay in relaxation phase  Mental Status: Judgment, insight: Intact Orientation: Oriented to time, place, and person Mood and affect: No depression, anxiety, or agitation     DATA REVIEWED: 09/16/18  TSH 0.004 uIU/mL TT4 12.1 (4.5-22.0) ug/dL     12/01/4823 TSH 2.4 uIU/mL FT4 1.09 ng/dL    Old records , labs and images have been reviewed.   ASSESSMENT/PLAN/RECOMMENDATIONS:   1. Hyperthyroidism Secondary to Graves' Disease, S/P RAI ablation on 07/31/2018:  - Clinically she continues to have hyperthyroid symptoms.  - Bio-chemically she is euthyroid . I explained to her that her palpitations and anxiety are not related to her thyroid at this time.  - Praised Pt on tobacco cessation - She will have repeat TFT's in 6 weeks, she was advised to call us sooner for lab work should she notice hypothyroid symptoms such as constipation, unexplained weight gain, fatigue, or depression.   2. Graves' Disease:   - No extrathyroidal manifestations of graves' disease    F/u in 3 months   Signed electronically by: Lyndle Herrlich, MD  [facility] Endocrinology  [facility] [address] [place] [place], [place] [address] Phone: [phone] FAX: [phone]   CC: Sonnie Alamo, PA-C [address] [place] [address] Phone: [phone] Fax: [phone]   Return to Endocrinology clinic as below: No future appointments.

## 2018-11-05 NOTE — Patient Instructions (Signed)
-   Your thyroid is normal on blood results.  - We will repeat your tests in 6 weeks

## 2018-11-19 ENCOUNTER — Ambulatory Visit: Payer: Self-pay | Admitting: Internal Medicine

## 2018-12-15 ENCOUNTER — Other Ambulatory Visit (INDEPENDENT_AMBULATORY_CARE_PROVIDER_SITE_OTHER): Payer: Federal, State, Local not specified - PPO

## 2018-12-15 DIAGNOSIS — Z923 Personal history of irradiation: Secondary | ICD-10-CM

## 2018-12-16 LAB — T4, FREE: Free T4: <0.25 ng/dL — ABNORMAL LOW (ref 0.60–1.60)

## 2018-12-16 LAB — TSH: TSH: 76.61 u[IU]/mL — ABNORMAL HIGH (ref 0.35–4.50)

## 2018-12-17 ENCOUNTER — Telehealth: Payer: Self-pay | Admitting: Internal Medicine

## 2018-12-17 MED ORDER — LEVOTHYROXINE SODIUM 100 MCG PO TABS: 100.0000 ug | ORAL_TABLET | Freq: Every day | ORAL | 3 refills | Status: DC

## 2018-12-17 NOTE — Telephone Encounter (Signed)
Please let her know that the treatment she received in October has kicked in, her thyroid has stopped working as expected .    She needs to start taking Levothyroxine 100 mcg daily   Its important to take it on an empty stomach , 30 minutes before breakfast with water.  If she is on Vitamins she needs to separate them from the levothyroxine by 4 hrs.    Please see which pharmacy to send the levothyroxine (its already attached , just update pharmacy please)  I will recheck her labs on next visit    Thank you   Melanie Raelyn Mora, MD  [facility] Endocrinology  [facility] [address] [place], [place] [address] Phone: [phone] FAX: [phone]

## 2018-12-17 NOTE — Telephone Encounter (Signed)
Spoke with pt and informed her of Dr. Everlene Farrier message. Pt understood and verified pharmacy for this to be sent to.  The rx was sent. Pt had no additional questions at this time. Nothing further is needed

## 2018-12-17 NOTE — Progress Notes (Signed)
Please let her know that the treatment she received in October has kicked in, her thyroid has stopped working as expected .    She needs to start taking Levothyroxine 100 mcg daily   Its important to take it on an empty stomach , 30 minutes before breakfast with water.  If she is on Vitamins she needs to separate them from the levothyroxine by 4 hrs.     Please send the prescription to her pharmacy of choice    I will recheck her labs on next visit   Future Appointments  Date Time Provider Department Center  02/09/2019 10:10 AM Richrd Kuzniar, Konrad Dolores, MD [facility] None    Thank you    Abby Raelyn Mora, MD  [facility] Endocrinology  [facility] [place] [facility] [address] [place], [place] [address] Phone: [phone] FAX: [phone]

## 2019-02-05 ENCOUNTER — Other Ambulatory Visit: Payer: Self-pay

## 2019-02-09 ENCOUNTER — Ambulatory Visit (INDEPENDENT_AMBULATORY_CARE_PROVIDER_SITE_OTHER): Payer: Federal, State, Local not specified - PPO | Admitting: Internal Medicine

## 2019-02-09 ENCOUNTER — Encounter: Payer: Self-pay | Admitting: Internal Medicine

## 2019-02-09 ENCOUNTER — Other Ambulatory Visit: Payer: Self-pay

## 2019-02-09 VITALS — BP 118/84 | HR 78 | Temp 97.7°F | Ht 63.0 in | Wt 157.0 lb

## 2019-02-09 DIAGNOSIS — E890 Postprocedural hypothyroidism: Secondary | ICD-10-CM

## 2019-02-09 LAB — TSH: TSH: 11.22 u[IU]/mL — ABNORMAL HIGH (ref 0.35–4.50)

## 2019-02-09 LAB — T4, FREE: Free T4: 0.95 ng/dL (ref 0.60–1.60)

## 2019-02-09 NOTE — Patient Instructions (Signed)

## 2019-02-09 NOTE — Progress Notes (Signed)
Name: Melanie Travis  MRN/ DOB: [id number], 1971-02-14    Age/ Sex: 48 y.o., female    PCP: Sonnie AlamoWilliams, Allison R, PA-C (Inactive)   Reason for Endocrinology Evaluation: Hyperthyroidism     Date of Initial Endocrinology Evaluation: 11/05/2018    HPI: Melanie Travis is a 48 y.o. female with a past medical history of Lumbosacral DDD and  Asthma . The patient presented for initial endocrinology clinic visit on 11/05/2018 for consultative assistance with her hyperthyroidism.    HISTORICAL SUMMARY:  She was [facility] by [facility] in May, 2019 for hyperthyroidism. She was having palpitations, weight loss and tremors for 6 months prior to her presentation. She had a TSH of 0.029 uIU/mL with elevated FT4 3.7 ug/dL    She had an Uptake and scan that showed diffusely hyperfunctioning thyroid gland due to graves' disease., 24-hr uptake was 78 % with elevated TSI and TRAb levels.   She quit smoking in May, 2019  No B-blockers due to asthma    She was started initially on Methimazole in May, 2019 ,but due to persistent hyperthyroidism, she underwent RAI ablation on 07/31/18 with 15.2 mCi of 131- RAI    Moved from [place], [place], works for [employer].    In February, 2020 she was started on LT-4 replacement due to an elevated TSH of 76.61 uIU/mL   SUBJECTIVE:    Today (02/09/2019):  [name] is here for a 3 month follow up on post-ablative hypothyroidism . She is compliant with LT-4 replacement. She is having issues with her ability to lose weight. She denies palpitations, has dry skin and sluggish.  Her anxiety has improved.  She continues to have issues with seasonal eye allergies.  She believes her LT-4 replacement needs      HISTORY:   Past Medical History: Asthma, Allergies, graves' disease. L-S DDD.  Past Surgical History:  Past Surgical History:  Procedure Laterality Date  . hystrectomy    . SHOULDER SURGERY        Social History:  reports that she quit  smoking about 11 months ago. She has never used smokeless tobacco. She reports that she does not drink alcohol or use drugs.  Family History: family history includes Allergies in her father; Celiac disease in her mother.   HOME MEDICATIONS: Allergies as of 02/09/2019      Reactions   Articaine-epinephrine Anaphylaxis   Dexamethasone Other (See Comments)   Pressure in ears, rash Pressure in ears, rash   Prednisone Other (See Comments)   "makes me want to kill people"  And a rash "makes me want to kill people"  And a rash   Tramadol    Elavil  [amitriptyline Hcl] Rash   Propylene Glycol Itching, Rash   Soy Allergy Itching, Rash   Blisters in mouth Blisters in mouth      Medication List       Accurate as of February 09, 2019 10:13 AM. Always use your most recent med list.        levothyroxine 100 MCG tablet Commonly known as:  SYNTHROID, LEVOTHROID Take 1 tablet (100 mcg total) by mouth daily.   loratadine 10 MG tablet Commonly known as:  CLARITIN Take 10 mg by mouth daily.         REVIEW OF SYSTEMS: A comprehensive ROS was conducted with the patient and is negative except as per HPI and below:  Review of Systems  Constitutional: Negative for weight loss.  Eyes: Positive for pain and discharge.  Respiratory:       Allergy related.   Cardiovascular: Negative for chest pain and palpitations.  Gastrointestinal: Negative for constipation and diarrhea.  Skin: Negative.   Psychiatric/Behavioral: Negative for depression. The patient is not nervous/anxious.        OBJECTIVE:  VS: BP 118/84 (BP Location: Left Arm, Patient Position: Sitting, Cuff Size: Normal)   Pulse 78   Temp 97.7 F (36.5 C)   Ht 5\' 3"  (1.6 m)   Wt 157 lb (71.2 kg)   SpO2 97%   BMI 27.81 kg/m    Wt Readings from Last 3 Encounters:  02/09/19 157 lb (71.2 kg)  11/05/18 159 lb (72.1 kg)     EXAM: General: Pt appears well and is in NAD  Neck: General: Supple without adenopathy. Thyroid:  Thyroid size normal.  No goiter or nodules appreciated. No thyroid bruit.  Lungs: Clear with good BS bilat with no rales, rhonchi, or wheezes  Heart: Auscultation: RRR.  Abdomen: Normoactive bowel sounds, soft, nontender, without masses or organomegaly palpable  Extremities:  BL LE: No pretibial edema normal ROM and strength.  Skin: Hair: Texture and amount normal with gender appropriate distribution Skin Inspection: No rashes. Skin Palpation: Skin temperature, texture, and thickness normal to palpation  Neuro: Cranial nerves: II - XII grossly intact  Motor: Normal strength throughout DTRs: 2+ and symmetric in UE without delay in relaxation phase  Mental Status: Judgment, insight: Intact Orientation: Oriented to time, place, and person Mood and affect: No depression, anxiety, or agitation     DATA REVIEWED: Results for FLORIENE, CRESS (MRN [id number]) as of 02/10/2019 08:51  Ref. Range 02/09/2019 10:30  TSH Latest Ref Range: 0.35 - 4.50 uIU/mL 11.22 (H)  T4,Free(Direct) Latest Ref Range: 0.60 - 1.60 ng/dL 5.05    ASSESSMENT/PLAN/RECOMMENDATIONS:   1. Post-Ablative Hypothyroidism:    - Pt with multiple non-specific symptoms that could be attributed to her thyroid.  - Pt educated extensively on the correct way to take levothyroxine (first thing in the morning with water, 30 minutes before eating or taking other medications). - Pt encouraged to double dose the following day if she were to miss a dose given long half-life of levothyroxine.    Medications   Increase Levothyroxine to 112  mcg daily  Labs in 8 weeks   2. Graves' Disease:   - No extrathyroidal manifestations of graves' disease    F/u in 6 months   Signed electronically by: Lyndle Herrlich, MD  [facility] Endocrinology  [facility] [address] [place] [place], [place] [address] Phone: [phone] FAX: [phone]   CC: Sonnie Alamo, PA-C (Inactive) No address on file  Phone: None Fax: None   Return to Endocrinology clinic as below: No future appointments.

## 2019-02-10 MED ORDER — LEVOTHYROXINE SODIUM 112 MCG PO TABS: 112.0000 ug | ORAL_TABLET | Freq: Every day | ORAL | 3 refills | Status: DC

## 2019-03-30 ENCOUNTER — Other Ambulatory Visit: Payer: Federal, State, Local not specified - PPO

## 2019-04-01 ENCOUNTER — Other Ambulatory Visit (INDEPENDENT_AMBULATORY_CARE_PROVIDER_SITE_OTHER): Payer: Federal, State, Local not specified - PPO

## 2019-04-01 DIAGNOSIS — E890 Postprocedural hypothyroidism: Secondary | ICD-10-CM

## 2019-04-01 LAB — TSH: TSH: 7.72 u[IU]/mL — ABNORMAL HIGH (ref 0.35–4.50)

## 2019-04-01 LAB — T4, FREE: Free T4: 1.12 ng/dL (ref 0.60–1.60)

## 2019-04-02 ENCOUNTER — Other Ambulatory Visit: Payer: Self-pay | Admitting: Internal Medicine

## 2019-04-02 ENCOUNTER — Telehealth: Payer: Self-pay | Admitting: Internal Medicine

## 2019-04-02 MED ORDER — LEVOTHYROXINE SODIUM 150 MCG PO TABS: 150.0000 ug | ORAL_TABLET | Freq: Every day | ORAL | 6 refills | Status: DC

## 2019-04-02 NOTE — Telephone Encounter (Signed)
Melanie Travis,   Please let her know her thyroid function is better with the levothyroxine 112 mcg daily , its not back to normal yet. I am not sure why she had her labs done too soon. She was supposed to do them 2 months from her last visit here, not a month.   Either way, its too soon to make any changes.   I suggest she comes back for another lab testing in 4 weeks.   Thanks     Abby Raelyn Mora, MD  [facility] Endocrinology  [facility] [address] [place], [place] [address] Phone: [phone] FAX: [phone]

## 2019-04-02 NOTE — Telephone Encounter (Signed)
Spoke to pt and she stated that she was given that appt at the front when she checked out for her last visit and that she was under the impression that it was 8 wks in between(was 7 wks) and she wanted to know if she still needed repeat labs in 4 weeks, please advise.

## 2019-04-03 ENCOUNTER — Other Ambulatory Visit: Payer: Self-pay

## 2019-04-03 MED ORDER — LEVOTHYROXINE SODIUM 150 MCG PO TABS: 150.0000 ug | ORAL_TABLET | Freq: Every day | ORAL | 6 refills | Status: DC

## 2019-04-03 NOTE — Telephone Encounter (Signed)
Pt aware changed pharmacy and resent and scheduled repeat labs for 05/11/2019

## 2019-04-29 ENCOUNTER — Other Ambulatory Visit: Payer: Self-pay | Admitting: Internal Medicine

## 2019-04-29 DIAGNOSIS — E890 Postprocedural hypothyroidism: Secondary | ICD-10-CM

## 2019-05-11 ENCOUNTER — Other Ambulatory Visit: Payer: Self-pay

## 2019-05-11 ENCOUNTER — Other Ambulatory Visit (INDEPENDENT_AMBULATORY_CARE_PROVIDER_SITE_OTHER): Payer: Federal, State, Local not specified - PPO

## 2019-05-11 DIAGNOSIS — E890 Postprocedural hypothyroidism: Secondary | ICD-10-CM | POA: Diagnosis not present

## 2019-05-11 LAB — TSH: TSH: 1.83 u[IU]/mL (ref 0.35–4.50)

## 2019-05-11 LAB — T4, FREE: Free T4: 1.52 ng/dL (ref 0.60–1.60)

## 2019-05-13 ENCOUNTER — Telehealth: Payer: Self-pay | Admitting: Internal Medicine

## 2019-05-13 NOTE — Telephone Encounter (Signed)
Patient is returning a call in regards to her lab results  °

## 2019-05-13 NOTE — Telephone Encounter (Signed)
Spoke to pt and reviewed results.

## 2019-05-18 DIAGNOSIS — M5386 Other specified dorsopathies, lumbar region: Secondary | ICD-10-CM | POA: Diagnosis not present

## 2019-05-18 DIAGNOSIS — M5382 Other specified dorsopathies, cervical region: Secondary | ICD-10-CM | POA: Diagnosis not present

## 2019-05-18 DIAGNOSIS — M9901 Segmental and somatic dysfunction of cervical region: Secondary | ICD-10-CM | POA: Diagnosis not present

## 2019-05-18 DIAGNOSIS — M9904 Segmental and somatic dysfunction of sacral region: Secondary | ICD-10-CM | POA: Diagnosis not present

## 2019-06-01 DIAGNOSIS — M5382 Other specified dorsopathies, cervical region: Secondary | ICD-10-CM | POA: Diagnosis not present

## 2019-06-01 DIAGNOSIS — M5386 Other specified dorsopathies, lumbar region: Secondary | ICD-10-CM | POA: Diagnosis not present

## 2019-06-01 DIAGNOSIS — M9901 Segmental and somatic dysfunction of cervical region: Secondary | ICD-10-CM | POA: Diagnosis not present

## 2019-06-01 DIAGNOSIS — M9904 Segmental and somatic dysfunction of sacral region: Secondary | ICD-10-CM | POA: Diagnosis not present

## 2019-06-15 DIAGNOSIS — M5382 Other specified dorsopathies, cervical region: Secondary | ICD-10-CM | POA: Diagnosis not present

## 2019-06-15 DIAGNOSIS — M9904 Segmental and somatic dysfunction of sacral region: Secondary | ICD-10-CM | POA: Diagnosis not present

## 2019-06-15 DIAGNOSIS — M5386 Other specified dorsopathies, lumbar region: Secondary | ICD-10-CM | POA: Diagnosis not present

## 2019-06-15 DIAGNOSIS — M9901 Segmental and somatic dysfunction of cervical region: Secondary | ICD-10-CM | POA: Diagnosis not present

## 2019-06-22 DIAGNOSIS — M5382 Other specified dorsopathies, cervical region: Secondary | ICD-10-CM | POA: Diagnosis not present

## 2019-06-22 DIAGNOSIS — M5386 Other specified dorsopathies, lumbar region: Secondary | ICD-10-CM | POA: Diagnosis not present

## 2019-06-22 DIAGNOSIS — M9904 Segmental and somatic dysfunction of sacral region: Secondary | ICD-10-CM | POA: Diagnosis not present

## 2019-06-22 DIAGNOSIS — M9901 Segmental and somatic dysfunction of cervical region: Secondary | ICD-10-CM | POA: Diagnosis not present

## 2019-08-07 ENCOUNTER — Other Ambulatory Visit: Payer: Self-pay

## 2019-08-10 NOTE — Progress Notes (Signed)
Name: Layli Capshaw  MRN/ DOB: [id number], May 24, 1971    Age/ Sex: 48 y.o., female    PCP: Nicholes Calamity, PA-C (Inactive)   Reason for Endocrinology Evaluation: Hyperthyroidism     Date of Initial Endocrinology Evaluation: 11/05/2018    HPI: Ms. Cynthie Garmon is a 48 y.o. female with a past medical history of Lumbosacral DDD and  Asthma . The patient presented for initial endocrinology clinic visit on 11/05/2018 for consultative assistance with her hyperthyroidism.    HISTORICAL SUMMARY:  She was seen by [facility] Endocrinology in May, 2019 for hyperthyroidism. She was having palpitations, weight loss and tremors for 6 months prior to her presentation. She had a TSH of 0.029 uIU/mL with elevated FT4 3.7 ug/dL    She had an Uptake and scan that showed diffusely hyperfunctioning thyroid gland due to graves' disease., 24-hr uptake was 78 % with elevated TSI and TRAb levels.   She quit smoking in May, 2019  No B-blockers due to asthma    She was started initially on Methimazole in May, 2019 ,but due to persistent hyperthyroidism, she underwent RAI ablation on 07/31/18 with 15.2 mCi of 131- RAI    Moved from [place], [place], works for [employer].    In February, 2020 she was started on LT-4 replacement due to an elevated TSH of 76.61 uIU/mL   SUBJECTIVE:    Today (02/09/2019):  Ms. Mauceri is here for a 3 month follow up on post-ablative hypothyroidism . She is compliant with LT-4 replacement. She is having issues with her ability to lose weight. She denies palpitations, has dry skin and sluggish.  Her anxiety has improved.  She continues to have issues with seasonal eye allergies.  She believes her LT-4 replacement needs      HISTORY:   Past Medical History: Asthma, Allergies, graves' disease. L-S DDD.  Past Surgical History:  Past Surgical History:  Procedure Laterality Date  . hystrectomy    . SHOULDER SURGERY        Social History:  reports that she quit  smoking about 17 months ago. She has never used smokeless tobacco. She reports that she does not drink alcohol or use drugs.  Family History: family history includes Allergies in her father; Celiac disease in her mother.   HOME MEDICATIONS: Allergies as of 08/11/2019      Reactions   Articaine-epinephrine Anaphylaxis   Dexamethasone Other (See Comments)   Pressure in ears, rash Pressure in ears, rash   Prednisone Other (See Comments)   "makes me want to kill people"  And a rash "makes me want to kill people"  And a rash   Tramadol    Elavil  [amitriptyline Hcl] Rash   Propylene Glycol Itching, Rash   Soy Allergy Itching, Rash   Blisters in mouth Blisters in mouth      Medication List       Accurate as of August 11, 2019  3:05 PM. If you have any questions, ask your nurse or doctor.        levothyroxine 150 MCG tablet Commonly known as: SYNTHROID Take 1 tablet (150 mcg total) by mouth daily.   loratadine 10 MG tablet Commonly known as: CLARITIN Take 10 mg by mouth daily.   Vitamin D3 25 MCG (1000 UT) Caps Take by mouth.         REVIEW OF SYSTEMS: A comprehensive ROS was conducted with the patient and is negative except as per HPI and below:  Review of Systems  Constitutional: Positive for weight loss.  Eyes: Negative for pain and discharge.  Respiratory:       Allergy related.   Cardiovascular: Negative for chest pain and palpitations.  Gastrointestinal: Negative for constipation and diarrhea.  Skin: Negative.   Psychiatric/Behavioral: Negative for depression. The patient is not nervous/anxious.        OBJECTIVE:  VS: BP 122/78 (BP Location: Right Arm, Patient Position: Sitting, Cuff Size: Normal)   Pulse 79   Temp 97.9 F (36.6 C)   Ht 5\' 3"  (1.6 m)   Wt 150 lb 6.4 oz (68.2 kg)   SpO2 98%   BMI 26.64 kg/m    Wt Readings from Last 3 Encounters:  08/11/19 150 lb 6.4 oz (68.2 kg)  02/09/19 157 lb (71.2 kg)  11/05/18 159 lb (72.1 kg)      EXAM: General: Pt appears well and is in NAD  Neck: General: Supple without adenopathy. Thyroid: Thyroid size normal.  No goiter or nodules appreciated. No thyroid bruit.  Lungs: Clear with good BS bilat with no rales, rhonchi, or wheezes  Heart: Auscultation: RRR.  Abdomen: Normoactive bowel sounds, soft, nontender, without masses or organomegaly palpable  Extremities:  BL LE: No pretibial edema normal ROM and strength.  Skin: Hair: Texture and amount normal with gender appropriate distribution Skin Inspection: No rashes. Skin Palpation: Skin temperature, texture, and thickness normal to palpation  Neuro: Cranial nerves: II - XII grossly intact  Motor: Normal strength throughout DTRs: 2+ and symmetric in UE without delay in relaxation phase  Mental Status: Judgment, insight: Intact Orientation: Oriented to time, place, and person Mood and affect: No depression, anxiety, or agitation     DATA REVIEWED:  Results for CELSA, NORDAHL (MRN Oscar La) as of 08/11/2019 15:05  Ref. Range 05/11/2019 08:58 08/11/2019 08:47  TSH Latest Ref Range: 0.35 - 4.50 uIU/mL 1.83 1.36  T4,Free(Direct) Latest Ref Range: 0.60 - 1.60 ng/dL 08/13/2019 1.75    ASSESSMENT/PLAN/RECOMMENDATIONS:   1. Post-Ablative Hypothyroidism:    - She is clinically euthyroid  - Pt educated extensively on the correct way to take levothyroxine (first thing in the morning with water, 30 minutes before eating or taking other medications). - Pt encouraged to double dose the following day if she were to miss a dose given long half-life of levothyroxine.    Medications   Levothyroxine  150  mcg daily    2. Graves' Disease:   - No extrathyroidal manifestations of graves' disease    F/u in 1 yr   Signed electronically by: 1.02, MD  [facility] Endocrinology  [facility] [address] [place], [place] Phone: [phone] FAX: [phone]   CC: [phone], PA-C (Inactive) No address on file Phone: None Fax: None   Return to Endocrinology clinic as below: Future Appointments  Date Time Provider Department Center  08/15/2020  7:50 AM Shamleffer, 08/17/2020, MD [facility] None

## 2019-08-11 ENCOUNTER — Telehealth: Payer: Self-pay | Admitting: Internal Medicine

## 2019-08-11 ENCOUNTER — Ambulatory Visit (INDEPENDENT_AMBULATORY_CARE_PROVIDER_SITE_OTHER): Payer: Federal, State, Local not specified - PPO | Admitting: Internal Medicine

## 2019-08-11 ENCOUNTER — Other Ambulatory Visit: Payer: Self-pay

## 2019-08-11 ENCOUNTER — Encounter: Payer: Self-pay | Admitting: Internal Medicine

## 2019-08-11 VITALS — BP 122/78 | HR 79 | Temp 97.9°F | Ht 63.0 in | Wt 150.4 lb

## 2019-08-11 DIAGNOSIS — Z23 Encounter for immunization: Secondary | ICD-10-CM

## 2019-08-11 DIAGNOSIS — E890 Postprocedural hypothyroidism: Secondary | ICD-10-CM | POA: Diagnosis not present

## 2019-08-11 LAB — TSH: TSH: 1.36 u[IU]/mL (ref 0.35–4.50)

## 2019-08-11 LAB — T4, FREE: Free T4: 1.51 ng/dL (ref 0.60–1.60)

## 2019-08-11 MED ORDER — LEVOTHYROXINE SODIUM 150 MCG PO TABS: 150.0000 ug | ORAL_TABLET | Freq: Every day | ORAL | 3 refills | Status: DC

## 2019-08-11 NOTE — Telephone Encounter (Signed)
Please let her know her thyroid is normal. Continue current dose of levothyroxine

## 2019-08-11 NOTE — Patient Instructions (Signed)

## 2019-08-11 NOTE — Telephone Encounter (Signed)
Pt aware of results 

## 2019-10-12 DIAGNOSIS — M5382 Other specified dorsopathies, cervical region: Secondary | ICD-10-CM | POA: Diagnosis not present

## 2019-10-12 DIAGNOSIS — M5386 Other specified dorsopathies, lumbar region: Secondary | ICD-10-CM | POA: Diagnosis not present

## 2019-10-12 DIAGNOSIS — M9901 Segmental and somatic dysfunction of cervical region: Secondary | ICD-10-CM | POA: Diagnosis not present

## 2019-10-12 DIAGNOSIS — M9904 Segmental and somatic dysfunction of sacral region: Secondary | ICD-10-CM | POA: Diagnosis not present

## 2019-12-28 DIAGNOSIS — J301 Allergic rhinitis due to pollen: Secondary | ICD-10-CM | POA: Diagnosis not present

## 2019-12-28 DIAGNOSIS — J4520 Mild intermittent asthma, uncomplicated: Secondary | ICD-10-CM | POA: Diagnosis not present

## 2020-01-04 DIAGNOSIS — M9904 Segmental and somatic dysfunction of sacral region: Secondary | ICD-10-CM | POA: Diagnosis not present

## 2020-01-04 DIAGNOSIS — M5412 Radiculopathy, cervical region: Secondary | ICD-10-CM | POA: Diagnosis not present

## 2020-01-04 DIAGNOSIS — M5386 Other specified dorsopathies, lumbar region: Secondary | ICD-10-CM | POA: Diagnosis not present

## 2020-01-04 DIAGNOSIS — M9901 Segmental and somatic dysfunction of cervical region: Secondary | ICD-10-CM | POA: Diagnosis not present

## 2020-01-11 DIAGNOSIS — M5386 Other specified dorsopathies, lumbar region: Secondary | ICD-10-CM | POA: Diagnosis not present

## 2020-01-11 DIAGNOSIS — M9901 Segmental and somatic dysfunction of cervical region: Secondary | ICD-10-CM | POA: Diagnosis not present

## 2020-01-11 DIAGNOSIS — M5412 Radiculopathy, cervical region: Secondary | ICD-10-CM | POA: Diagnosis not present

## 2020-01-11 DIAGNOSIS — M9904 Segmental and somatic dysfunction of sacral region: Secondary | ICD-10-CM | POA: Diagnosis not present

## 2020-02-08 DIAGNOSIS — M9904 Segmental and somatic dysfunction of sacral region: Secondary | ICD-10-CM | POA: Diagnosis not present

## 2020-02-08 DIAGNOSIS — M5412 Radiculopathy, cervical region: Secondary | ICD-10-CM | POA: Diagnosis not present

## 2020-02-08 DIAGNOSIS — M9901 Segmental and somatic dysfunction of cervical region: Secondary | ICD-10-CM | POA: Diagnosis not present

## 2020-02-08 DIAGNOSIS — M5386 Other specified dorsopathies, lumbar region: Secondary | ICD-10-CM | POA: Diagnosis not present

## 2020-02-14 DIAGNOSIS — H8112 Benign paroxysmal vertigo, left ear: Secondary | ICD-10-CM | POA: Diagnosis not present

## 2020-08-15 ENCOUNTER — Encounter: Payer: Self-pay | Admitting: Internal Medicine

## 2020-08-15 ENCOUNTER — Other Ambulatory Visit: Payer: Self-pay

## 2020-08-15 ENCOUNTER — Ambulatory Visit (INDEPENDENT_AMBULATORY_CARE_PROVIDER_SITE_OTHER): Payer: Federal, State, Local not specified - PPO | Admitting: Internal Medicine

## 2020-08-15 VITALS — BP 128/84 | HR 82 | Temp 97.2°F | Ht 63.0 in | Wt 159.6 lb

## 2020-08-15 DIAGNOSIS — E890 Postprocedural hypothyroidism: Secondary | ICD-10-CM

## 2020-08-15 DIAGNOSIS — Z23 Encounter for immunization: Secondary | ICD-10-CM | POA: Diagnosis not present

## 2020-08-15 LAB — TSH: TSH: 1.07 u[IU]/mL (ref 0.35–4.50)

## 2020-08-15 MED ORDER — LEVOTHYROXINE SODIUM 150 MCG PO TABS: 150.0000 ug | ORAL_TABLET | Freq: Every day | ORAL | 3 refills | Status: DC

## 2020-08-15 NOTE — Progress Notes (Signed)
Name: Melanie Travis  MRN/ DOB: [id number], 06-24-1971    Age/ Sex: 49 y.o., female    PCP: Sonnie Alamo, PA-C (Inactive)   Reason for Endocrinology Evaluation: Hyperthyroidism     Date of Initial Endocrinology Evaluation: 11/05/2018    HPI: Melanie Travis is a 49 y.o. female with a past medical history of Lumbosacral DDD and  Asthma . The patient presented for initial endocrinology clinic visit on 11/05/2018 for consultative assistance with her hyperthyroidism.    HISTORICAL SUMMARY:  She was seen by [facility] in May, 2019 for hyperthyroidism. She was having palpitations, weight loss and tremors for 6 months prior to her presentation. She had a TSH of 0.029 uIU/mL with elevated FT4 3.7 ug/dL    She had an Uptake and scan that showed diffusely hyperfunctioning thyroid gland due to graves' disease., 24-hr uptake was 78 % with elevated TSI and TRAb levels.   She quit smoking in May, 2019  No B-blockers due to asthma    She was started initially on Methimazole in May, 2019 ,but due to persistent hyperthyroidism, she underwent RAI ablation on 07/31/18 with 15.2 mCi of 131- RAI    Moved from [place], [place], works for [employer].    In February, 2020 she was started on LT-4 replacement due to an elevated TSH of 76.61 uIU/mL   SUBJECTIVE:    Today (08/15/2020):  Melanie Travis is here for a follow up on post-ablative hypothyroidism . She is compliant with LT-4 replacement.   She denies palpitations Has chills and feels yucky, she is tired a lot  Has occasional sleep issues.  No local neck symptoms   No blurry vision     HISTORY:   Past Medical History: Asthma, Allergies, graves' disease. L-S DDD.  Past Surgical History:  Past Surgical History:  Procedure Laterality Date   hystrectomy     SHOULDER SURGERY        Social History:  reports that she quit smoking about 2 years ago. She has never used smokeless tobacco. She reports that she does  not drink alcohol and does not use drugs.  Family History: family history includes Allergies in her father; Celiac disease in her mother.   HOME MEDICATIONS: Allergies as of 08/15/2020      Reactions   Articaine-epinephrine Anaphylaxis   Dexamethasone Other (See Comments)   Pressure in ears, rash Pressure in ears, rash   Prednisone Other (See Comments)   "makes me want to kill people"  And a rash "makes me want to kill people"  And a rash   Tramadol    Elavil  [amitriptyline Hcl] Rash   Propylene Glycol Itching, Rash   Soy Allergy Itching, Rash   Blisters in mouth Blisters in mouth      Medication List       Accurate as of August 15, 2020  2:05 PM. If you have any questions, ask your nurse or doctor.        levothyroxine 150 MCG tablet Commonly known as: SYNTHROID Take 1 tablet (150 mcg total) by mouth daily.   loratadine 10 MG tablet Commonly known as: CLARITIN Take 10 mg by mouth daily.   Vitamin D3 25 MCG (1000 UT) Caps Take by mouth.         REVIEW OF SYSTEMS: A comprehensive ROS was conducted with the patient and is negative except as per HPI and below:  Review of Systems  Constitutional: Positive for weight loss.  Eyes: Negative for pain  and discharge.  Respiratory:       Allergy related.   Cardiovascular: Negative for chest pain and palpitations.  Gastrointestinal: Negative for constipation and diarrhea.  Skin: Negative.   Psychiatric/Behavioral: Negative for depression. The patient is not nervous/anxious.        OBJECTIVE:  VS: BP 128/84    Pulse 82    Temp (!) 97.2 F (36.2 C) (Temporal)    Ht 5\' 3"  (1.6 m)    Wt 159 lb 9 oz (72.4 kg)    SpO2 98%    BMI 28.27 kg/m    Wt Readings from Last 3 Encounters:  08/15/20 159 lb 9 oz (72.4 kg)  08/11/19 150 lb 6.4 oz (68.2 kg)  02/09/19 157 lb (71.2 kg)     EXAM: General: Pt appears well and is in NAD  Neck: General: Supple without adenopathy. Thyroid: Thyroid size normal.  No goiter or  nodules appreciated.  Lungs: Clear with good BS bilat with no rales, rhonchi, or wheezes  Heart: Auscultation: RRR.  Abdomen: Normoactive bowel sounds, soft, nontender, without masses or organomegaly palpable  Extremities:  BL LE: No pretibial edema normal ROM and strength.  Mental Status: Judgment, insight: Intact Orientation: Oriented to time, place, and person Mood and affect: No depression, anxiety, or agitation     DATA REVIEWED:  Results for OYINKANSOLA, TRUAX (MRN Oscar La) as of 08/15/2020 14:05  Ref. Range 08/15/2020 08:29  TSH Latest Ref Range: 0.35 - 4.50 uIU/mL 1.07    ASSESSMENT/PLAN/RECOMMENDATIONS:   1. Post-Ablative Hypothyroidism:    - She is clinically euthyroid  - No local neck symptoms  - Repeat TSh is normal   Medications   Continue Levothyroxine  150  mcg daily    2. Graves' Disease:   - No extrathyroidal manifestations of graves' disease    F/u in 1 yr   Of note the pt urged to establish with a primary care physician   Signed electronically by: 08/17/2020, MD  [facility]  [facility] [address] [place] [place], [place] Phone: [phone] FAX: [phone]   CC: [phone], PA-C (Inactive) No address on file Phone: None Fax: None   Return to Endocrinology clinic as below: Future Appointments  Date Time Provider Department Center  11/10/2020 11:30 AM 11/12/2020, MD [facility]  08/21/2021  9:10 AM Babs Dabbs, 08/23/2021, MD [facility] None

## 2020-08-15 NOTE — Patient Instructions (Signed)
-   Please get a Primary Care Physician    - Continue Levothyroxine 150 mcg daily

## 2020-09-13 DIAGNOSIS — M9901 Segmental and somatic dysfunction of cervical region: Secondary | ICD-10-CM | POA: Diagnosis not present

## 2020-09-13 DIAGNOSIS — M5386 Other specified dorsopathies, lumbar region: Secondary | ICD-10-CM | POA: Diagnosis not present

## 2020-09-13 DIAGNOSIS — M5412 Radiculopathy, cervical region: Secondary | ICD-10-CM | POA: Diagnosis not present

## 2020-09-13 DIAGNOSIS — M9904 Segmental and somatic dysfunction of sacral region: Secondary | ICD-10-CM | POA: Diagnosis not present

## 2020-11-10 ENCOUNTER — Ambulatory Visit: Payer: Federal, State, Local not specified - PPO | Admitting: Family Medicine

## 2020-12-12 ENCOUNTER — Encounter: Payer: Self-pay | Admitting: Family Medicine

## 2020-12-12 ENCOUNTER — Other Ambulatory Visit: Payer: Self-pay

## 2020-12-12 ENCOUNTER — Ambulatory Visit (INDEPENDENT_AMBULATORY_CARE_PROVIDER_SITE_OTHER): Payer: Federal, State, Local not specified - PPO | Admitting: Family Medicine

## 2020-12-12 VITALS — BP 128/72 | HR 91 | Temp 97.9°F | Resp 16 | Ht 63.0 in | Wt 155.2 lb

## 2020-12-12 DIAGNOSIS — Z0000 Encounter for general adult medical examination without abnormal findings: Secondary | ICD-10-CM

## 2020-12-12 DIAGNOSIS — Z7185 Encounter for immunization safety counseling: Secondary | ICD-10-CM | POA: Diagnosis not present

## 2020-12-12 DIAGNOSIS — Z1211 Encounter for screening for malignant neoplasm of colon: Secondary | ICD-10-CM

## 2020-12-12 DIAGNOSIS — N951 Menopausal and female climacteric states: Secondary | ICD-10-CM

## 2020-12-12 DIAGNOSIS — J4520 Mild intermittent asthma, uncomplicated: Secondary | ICD-10-CM | POA: Insufficient documentation

## 2020-12-12 DIAGNOSIS — M5137 Other intervertebral disc degeneration, lumbosacral region: Secondary | ICD-10-CM

## 2020-12-12 DIAGNOSIS — Z1212 Encounter for screening for malignant neoplasm of rectum: Secondary | ICD-10-CM

## 2020-12-12 DIAGNOSIS — Z1231 Encounter for screening mammogram for malignant neoplasm of breast: Secondary | ICD-10-CM | POA: Diagnosis not present

## 2020-12-12 DIAGNOSIS — Z923 Personal history of irradiation: Secondary | ICD-10-CM

## 2020-12-12 DIAGNOSIS — E0500 Thyrotoxicosis with diffuse goiter without thyrotoxic crisis or storm: Secondary | ICD-10-CM | POA: Diagnosis not present

## 2020-12-12 DIAGNOSIS — Z91018 Allergy to other foods: Secondary | ICD-10-CM | POA: Insufficient documentation

## 2020-12-12 LAB — CBC WITH DIFFERENTIAL/PLATELET
Basophils Absolute: 0 10*3/uL (ref 0.0–0.1)
Basophils Relative: 0.7 % (ref 0.0–3.0)
Eosinophils Absolute: 0.1 10*3/uL (ref 0.0–0.7)
Eosinophils Relative: 1.4 % (ref 0.0–5.0)
HCT: 41.1 % (ref 36.0–46.0)
Hemoglobin: 13.9 g/dL (ref 12.0–15.0)
Lymphocytes Relative: 29.4 % (ref 12.0–46.0)
Lymphs Abs: 1.6 10*3/uL (ref 0.7–4.0)
MCHC: 33.9 g/dL (ref 30.0–36.0)
MCV: 84.1 fl (ref 78.0–100.0)
Monocytes Absolute: 0.4 10*3/uL (ref 0.1–1.0)
Monocytes Relative: 7.6 % (ref 3.0–12.0)
Neutro Abs: 3.3 10*3/uL (ref 1.4–7.7)
Neutrophils Relative %: 60.9 % (ref 43.0–77.0)
Platelets: 288 10*3/uL (ref 150.0–400.0)
RBC: 4.89 Mil/uL (ref 3.87–5.11)
RDW: 12.8 % (ref 11.5–15.5)
WBC: 5.5 10*3/uL (ref 4.0–10.5)

## 2020-12-12 LAB — COMPREHENSIVE METABOLIC PANEL
ALT: 19 U/L (ref 0–35)
AST: 17 U/L (ref 0–37)
Albumin: 4.7 g/dL (ref 3.5–5.2)
Alkaline Phosphatase: 83 U/L (ref 39–117)
BUN: 19 mg/dL (ref 6–23)
CO2: 29 mEq/L (ref 19–32)
Calcium: 10.1 mg/dL (ref 8.4–10.5)
Chloride: 104 mEq/L (ref 96–112)
Creatinine, Ser: 0.76 mg/dL (ref 0.40–1.20)
GFR: 91 mL/min (ref >60.00)
Glucose, Bld: 90 mg/dL (ref 70–99)
Potassium: 4.1 mEq/L (ref 3.5–5.1)
Sodium: 141 mEq/L (ref 135–145)
Total Bilirubin: 0.3 mg/dL (ref 0.2–1.2)
Total Protein: 7.7 g/dL (ref 6.0–8.3)

## 2020-12-12 LAB — LIPID PANEL
Cholesterol: 237 mg/dL — ABNORMAL HIGH (ref 0–200)
HDL: 54.5 mg/dL (ref >39.00)
LDL Cholesterol: 149 mg/dL — ABNORMAL HIGH (ref 0–99)
NonHDL: 182.59
Total CHOL/HDL Ratio: 4
Triglycerides: 167 mg/dL — ABNORMAL HIGH (ref 0.0–149.0)
VLDL: 33.4 mg/dL (ref 0.0–40.0)

## 2020-12-12 LAB — FOLLICLE STIMULATING HORMONE: FSH: 118.9 m[IU]/mL

## 2020-12-12 NOTE — Patient Instructions (Signed)
Please return in 12 months for your annual complete physical; please come fasting.  Please sign up for Mychart. I have texted you the link. I will release your lab results to you on your MyChart account with further instructions. Please reply with any questions.   I recommend the Cologuard test for your colon cancer screening that is due. I have ordered this test for you. [facility] will soon contact you to verify your insurance, address etc. They will then send you the kit; follow the instructions in the kit and return the kit to [facility]. They will run the test and send the results to me. I will then give you the results. If this test is negative, we recommend repeating a colon cancer screening test in 3 years. If it is positive, I will refer you to a Gastroenterologist so you can get set up for the recommended colonoscopy.  Thank you!  I have ordered a mammogram and/or bone density for you as we discussed today: _0   Mammogram  _1   Bone Density  Please call the office checked below to schedule your appointment: Your appointment will at the following location  _2   [facility]      [place], [place]         _3   [facility]  [address] [place], [place]   It was a pleasure meeting you today! Thank you for choosing [place] to meet your healthcare needs! I truly look forward to working with you. If you have any questions or concerns, please send me a message via Mychart or call the office at [phone].

## 2020-12-12 NOTE — Progress Notes (Signed)
Subjective  Chief Complaint  Patient presents with  . Establish Care    HPI: Melanie Travis is a 50 y.o. female who presents to [facility] [place] [facility] today for a Female Wellness Visit. She also has the concerns and/or needs as listed above in the chief complaint. These will be addressed in addition to the Health Maintenance Visit.  50 year old married female, works full-time from [employer], describes himself as a workaholic here to establish care.  Needs primary care doctor.  I reviewed recent notes from endocrinology.  Has not had physical in many years.  Many screening tests need to be done.  We reviewed her past medical history in detail.  Documented below. Wellness Visit: annual visit with health maintenance review and exam without Pap   Health maintenance: Overdue for mammogram, colon cancer screening, average risk, eligible for Shingrix, Tdap and Covid.  Has multiple allergies and worries about allergy to Covid vaccines due to possible glycol.  She is active and feels well overall.  She and her husband have 6 children combined.  They are all grown out of the home.  13 grandchildren.  No mood problems.  Low fall risk. Chronic disease f/u and/or acute problem visit: (deemed necessary to be done in addition to the wellness visit):  Mild intermittent asthma without complication no recent exacerbations.  No current concerns.  Multiple allergies and food allergies.  Has seen allergist in the past.  Self manages.  History of Graves' disease and status post iodine thyroid ablation: Currently well controlled on Synthroid 50 mcg daily.  Reviewed recent endocrinology notes.  Recent TSH was at goal.  Physically feels fine.  DJD lumbosacral: Intermittently painful.  Mostly with travel.  Complains of 1 year of hot flashes.  She is status post hysterectomy for fibroids.  They awaken her at night.  Has not tried any medications for it.   Assessment  1. Annual physical exam    2. Mild intermittent asthma without complication   3. H/O radioactive iodine thyroid ablation   4. Graves disease   5. DDD (degenerative disc disease), lumbosacral   6. Screening for colorectal cancer   7. Encounter for screening mammogram for breast cancer   8. Hot flushes, perimenopausal   9. Vaccine counseling   10. Multiple food allergies      Plan  Female Wellness Visit:  Age appropriate Health Maintenance and Prevention measures were discussed with patient. Included topics are cancer screening recommendations, ways to keep healthy (see AVS) including dietary and exercise recommendations, regular eye and dental care, use of seat belts, and avoidance of moderate alcohol use and tobacco use.  Recommend mammogram and ordered.  Discussed colon cancer screening options.  Patient elects Cologuard.  BMI: discussed patient's BMI and encouraged positive lifestyle modifications to help get to or maintain a target BMI.  HM needs and immunizations were addressed and ordered. See below for orders. See HM and immunization section for updates.  Recommend Tdap, Shingrix and Covid.  Covid allergy to be discussed with allergist.  Counseling given.    Routine labs and screening tests ordered including cmp, cbc and lipids where appropriate.  Discussed recommendations regarding Vit D and calcium supplementation (see AVS)  Chronic disease management visit and/or acute problem visit:  Asthma, mild intermittent without recent flare.  Postoperative hypothyroidism currently well controlled.  Vaccine counseling  Hot flashes: Check FSH to see if peri or postmenopausal.  DJD: Not currently active.  Will monitor.  Multiple allergies and food  allergies: Recommend premedicating with Benadryl prior to immunizations.   Follow up: 12 months for complete physical Orders Placed This Encounter  Procedures  . MM DIGITAL SCREENING BILATERAL  . CBC with Differential/Platelet  . Comprehensive metabolic  panel  . Lipid panel  . Hepatitis C antibody  . Cologuard  . Follicle stimulating hormone   No orders of the defined types were placed in this encounter.     Body mass index is 27.49 kg/m. Wt Readings from Last 3 Encounters:  12/12/20 155 lb 3.2 oz (70.4 kg)  08/15/20 159 lb 9 oz (72.4 kg)  08/11/19 150 lb 6.4 oz (68.2 kg)     Patient Active Problem List   Diagnosis Date Noted  . Mild intermittent asthma 12/12/2020  . Multiple food allergies 12/12/2020  . Hot flushes, perimenopausal 12/12/2020  . Graves disease 11/05/2018  . H/O radioactive iodine thyroid ablation 11/05/2018  . DDD (degenerative disc disease), lumbosacral 08/18/2012   Health Maintenance  Topic Date Due  . Hepatitis C Screening  Never done  . COVID-19 Vaccine (1) Never done  . TETANUS/TDAP  11/15/2010  . MAMMOGRAM  05/04/2011  . COLONOSCOPY (Pts 45-19yrs Insurance coverage will need to be confirmed)  Never done  . INFLUENZA VACCINE  Completed  . HIV Screening  Completed   Immunization History  Administered Date(s) Administered  . Influenza,inj,Quad PF,6+ Mos 08/11/2019, 08/15/2020  . Tdap 11/15/2000   We updated and reviewed the patient's past history in detail and it is documented below. Allergies: Patient is allergic to articaine-epinephrine, lidocaine, prednisone, dexamethasone, tramadol, elavil  [amitriptyline hcl], propylene glycol, and soy allergy. Past Medical History Patient  has no past medical history on file. Past Surgical History Patient  has a past surgical history that includes hystrectomy and Shoulder surgery. Family History: Patient family history includes Allergies in her father; Celiac disease in her mother. Social History:  Patient  reports that she quit smoking about 2 years ago. She has never used smokeless tobacco. She reports that she does not drink alcohol and does not use drugs.  Review of Systems: Constitutional: negative for fever or malaise Ophthalmic: negative for  photophobia, double vision or loss of vision Cardiovascular: negative for chest pain, dyspnea on exertion, or new LE swelling Respiratory: negative for SOB or persistent cough Gastrointestinal: negative for abdominal pain, change in bowel habits or melena Genitourinary: negative for dysuria or gross hematuria, no abnormal uterine bleeding or disharge Musculoskeletal: negative for new gait disturbance or muscular weakness Integumentary: negative for new or persistent rashes, no breast lumps Neurological: negative for TIA or stroke symptoms Psychiatric: negative for SI or delusions Allergic/Immunologic: negative for hives  Patient Care Team    Relationship Specialty Notifications Start End  Willow Ora, MD PCP - General Family Medicine  12/12/20     Objective  Vitals: BP 128/72   Pulse 91   Temp 97.9 F (36.6 C)   Resp 16   Ht 5\' 3"  (1.6 m)   Wt 155 lb 3.2 oz (70.4 kg)   SpO2 98%   BMI 27.49 kg/m  General:  Well developed, well nourished, no acute distress  Psych:  Alert and orientedx3,normal mood and affect HEENT:  Normocephalic, atraumatic, non-icteric sclera,  supple neck without adenopathy, mass or thyromegaly Cardiovascular:  Normal S1, S2, RRR without gallop, rub or murmur Respiratory:  Good breath sounds bilaterally, CTAB with normal respiratory effort Gastrointestinal: normal bowel sounds, soft, non-tender, no noted masses. No HSM MSK: no deformities, contusions. Joints are without erythema  or swelling.  Skin:  Warm, no rashes or suspicious lesions noted Neurologic:    Mental status is normal. Gross motor and sensory exams are normal. Normal gait. No tremor Breast Exam: deferred   Commons side effects, risks, benefits, and alternatives for medications and treatment plan prescribed today were discussed, and the patient expressed understanding of the given instructions. Patient is instructed to call or message via MyChart if he/she has any questions or concerns regarding  our treatment plan. No barriers to understanding were identified. We discussed Red Flag symptoms and signs in detail. Patient expressed understanding regarding what to do in case of urgent or emergency type symptoms.   Medication list was reconciled, printed and provided to the patient in AVS. Patient instructions and summary information was reviewed with the patient as documented in the AVS. This note was prepared with assistance of Dragon voice recognition software. Occasional wrong-word or sound-a-like substitutions may have occurred due to the inherent limitations of voice recognition software  This visit occurred during the SARS-CoV-2 public health emergency.  Safety protocols were in place, including screening questions prior to the visit, additional usage of staff PPE, and extensive cleaning of exam room while observing appropriate contact time as indicated for disinfecting solutions.

## 2020-12-13 ENCOUNTER — Encounter: Payer: Self-pay | Admitting: Family Medicine

## 2020-12-13 LAB — HEPATITIS C ANTIBODY
Hepatitis C Ab: NONREACTIVE
SIGNAL TO CUT-OFF: 0.01 (ref <1.00)

## 2020-12-13 NOTE — Progress Notes (Signed)
Please call patient: I have reviewed his/her lab results. Labs show she is postmenopausal. Can follow up if needs help managing her symptoms.  Other labs are all fine.  Can send copy if she'd lik. She didn't sign up for mychart.   The 10-year ASCVD risk score Denman George DC Montez Hageman., et al., 2013) is: 1.7%   Values used to calculate the score:     Age: 50 years     Sex: Female     Is Non-Hispanic African American: No     Diabetic: No     Tobacco smoker: No     Systolic Blood Pressure: 128 mmHg     Is BP treated: No     HDL Cholesterol: 54.5 mg/dL     Total Cholesterol: 237 mg/dL

## 2021-02-17 LAB — EXTERNAL GENERIC LAB PROCEDURE: COLOGUARD: NEGATIVE

## 2021-02-17 LAB — COLOGUARD
COLOGUARD: NEGATIVE
Cologuard: NEGATIVE

## 2021-03-23 DIAGNOSIS — M9901 Segmental and somatic dysfunction of cervical region: Secondary | ICD-10-CM | POA: Diagnosis not present

## 2021-03-23 DIAGNOSIS — M5412 Radiculopathy, cervical region: Secondary | ICD-10-CM | POA: Diagnosis not present

## 2021-03-28 ENCOUNTER — Encounter: Payer: Self-pay | Admitting: Family Medicine

## 2021-03-30 DIAGNOSIS — M5412 Radiculopathy, cervical region: Secondary | ICD-10-CM | POA: Diagnosis not present

## 2021-03-30 DIAGNOSIS — M9901 Segmental and somatic dysfunction of cervical region: Secondary | ICD-10-CM | POA: Diagnosis not present

## 2021-05-05 ENCOUNTER — Other Ambulatory Visit: Payer: Self-pay

## 2021-05-05 ENCOUNTER — Ambulatory Visit
Admission: RE | Admit: 2021-05-05 | Discharge: 2021-05-05 | Disposition: A | Discharge status: Home / Self Care | Payer: Federal, State, Local not specified - PPO | Source: Ambulatory Visit | Attending: Family Medicine | Admitting: Family Medicine

## 2021-05-05 DIAGNOSIS — Z0000 Encounter for general adult medical examination without abnormal findings: Secondary | ICD-10-CM

## 2021-05-05 DIAGNOSIS — Z1231 Encounter for screening mammogram for malignant neoplasm of breast: Secondary | ICD-10-CM | POA: Diagnosis not present

## 2021-05-26 ENCOUNTER — Other Ambulatory Visit: Payer: Self-pay | Admitting: Family Medicine

## 2021-05-26 ENCOUNTER — Ambulatory Visit
Admission: RE | Admit: 2021-05-26 | Discharge: 2021-05-26 | Disposition: A | Discharge status: Home / Self Care | Payer: Federal, State, Local not specified - PPO | Source: Ambulatory Visit | Attending: Family Medicine | Admitting: Family Medicine

## 2021-05-26 DIAGNOSIS — N6489 Other specified disorders of breast: Secondary | ICD-10-CM

## 2021-05-26 DIAGNOSIS — R922 Inconclusive mammogram: Secondary | ICD-10-CM | POA: Diagnosis not present

## 2021-05-26 DIAGNOSIS — R928 Other abnormal and inconclusive findings on diagnostic imaging of breast: Secondary | ICD-10-CM | POA: Diagnosis not present

## 2021-06-02 ENCOUNTER — Ambulatory Visit
Admission: RE | Admit: 2021-06-02 | Discharge: 2021-06-02 | Disposition: A | Discharge status: Home / Self Care | Payer: Federal, State, Local not specified - PPO | Source: Ambulatory Visit | Attending: Family Medicine | Admitting: Family Medicine

## 2021-06-02 ENCOUNTER — Other Ambulatory Visit: Payer: Self-pay

## 2021-06-02 DIAGNOSIS — N6314 Unspecified lump in the right breast, lower inner quadrant: Secondary | ICD-10-CM | POA: Diagnosis not present

## 2021-06-02 DIAGNOSIS — N6489 Other specified disorders of breast: Secondary | ICD-10-CM

## 2021-06-02 DIAGNOSIS — N62 Hypertrophy of breast: Secondary | ICD-10-CM | POA: Diagnosis not present

## 2021-06-02 DIAGNOSIS — D241 Benign neoplasm of right breast: Secondary | ICD-10-CM | POA: Diagnosis not present

## 2021-06-02 DIAGNOSIS — N6011 Diffuse cystic mastopathy of right breast: Secondary | ICD-10-CM | POA: Diagnosis not present

## 2021-06-02 DIAGNOSIS — R928 Other abnormal and inconclusive findings on diagnostic imaging of breast: Secondary | ICD-10-CM | POA: Diagnosis not present

## 2021-06-02 DIAGNOSIS — R921 Mammographic calcification found on diagnostic imaging of breast: Secondary | ICD-10-CM | POA: Diagnosis not present

## 2021-06-02 HISTORY — PX: BREAST BIOPSY: SHX20

## 2021-06-06 ENCOUNTER — Telehealth: Payer: Self-pay

## 2021-06-06 ENCOUNTER — Other Ambulatory Visit: Payer: Self-pay | Admitting: Family Medicine

## 2021-06-06 DIAGNOSIS — R921 Mammographic calcification found on diagnostic imaging of breast: Secondary | ICD-10-CM

## 2021-06-06 NOTE — Telephone Encounter (Signed)
Patient is calling regarding her biopsy results  She is very concerned and would like a call back asap

## 2021-06-06 NOTE — Telephone Encounter (Signed)
Patient would like information about her biopsy results. States she is very concerned.

## 2021-06-07 NOTE — Telephone Encounter (Signed)
[facility] did speak with [facility] concerning biopsy results

## 2021-06-16 ENCOUNTER — Telehealth: Payer: Self-pay

## 2021-06-16 NOTE — Telephone Encounter (Signed)
Pt called in for an appt for her medication on 10/6. She stated that Dr Mardelle Matte order labs before her appointments. Do we need to schedule labs? Please Advise.

## 2021-06-16 NOTE — Telephone Encounter (Signed)
Can labs be done before her appt? Please advise

## 2021-06-20 NOTE — Telephone Encounter (Signed)
LMOVM advising patient of PCP recommendations listed below 

## 2021-06-21 ENCOUNTER — Ambulatory Visit
Admission: RE | Admit: 2021-06-21 | Discharge: 2021-06-21 | Disposition: A | Discharge status: Home / Self Care | Payer: Federal, State, Local not specified - PPO | Source: Ambulatory Visit | Attending: Family Medicine | Admitting: Family Medicine

## 2021-06-21 ENCOUNTER — Other Ambulatory Visit: Payer: Self-pay

## 2021-06-21 DIAGNOSIS — R921 Mammographic calcification found on diagnostic imaging of breast: Secondary | ICD-10-CM

## 2021-06-21 DIAGNOSIS — N62 Hypertrophy of breast: Secondary | ICD-10-CM | POA: Diagnosis not present

## 2021-06-21 HISTORY — PX: BREAST BIOPSY: SHX20

## 2021-06-30 DIAGNOSIS — N6489 Other specified disorders of breast: Secondary | ICD-10-CM | POA: Diagnosis not present

## 2021-06-30 DIAGNOSIS — D241 Benign neoplasm of right breast: Secondary | ICD-10-CM | POA: Diagnosis not present

## 2021-07-16 ENCOUNTER — Other Ambulatory Visit: Payer: Self-pay

## 2021-07-16 ENCOUNTER — Encounter: Payer: Self-pay | Admitting: Emergency Medicine

## 2021-07-16 ENCOUNTER — Emergency Department (INDEPENDENT_AMBULATORY_CARE_PROVIDER_SITE_OTHER)
Admission: EM | Admit: 2021-07-16 | Discharge: 2021-07-16 | Disposition: A | Discharge status: Home / Self Care | Payer: Federal, State, Local not specified - PPO | Source: Home / Self Care | Attending: Family Medicine | Admitting: Family Medicine

## 2021-07-16 ENCOUNTER — Inpatient Hospital Stay
Admission: RE | Admit: 2021-07-16 | Discharge: 2021-07-16 | Disposition: A | Discharge status: Home / Self Care | Payer: Self-pay | Source: Ambulatory Visit

## 2021-07-16 DIAGNOSIS — R6889 Other general symptoms and signs: Secondary | ICD-10-CM | POA: Diagnosis not present

## 2021-07-16 NOTE — Discharge Instructions (Signed)
Stay home and rest next week May take OTC cough and cold medicine Tylenol or ibuprofen for pain or fever Drink lots of fluids Your results will be available on my Chart

## 2021-07-16 NOTE — ED Provider Notes (Signed)
[facility]    CSN: [id number] Arrival date & time: 07/16/21  9211      History   Chief Complaint Chief Complaint  Patient presents with   Appointment    Cough/fever    HPI Melanie Travis is a 50 y.o. female.   HPI Patient states she has had a cough, fever, and mild sore throat for the last couple of days.  She has not COVID vaccinated.  She has been doing a lot of traveling with her job.  She has traveling yet to do this week, is supposed to go to [place] tomorrow and to [place] later in the week.  She does not feel well.  She states very tired and weak. Past Medical History:  Diagnosis Date   Allergy    Arthritis    Graves disease    Thyroid disease     Patient Active Problem List   Diagnosis Date Noted   Mild intermittent asthma 12/12/2020   Multiple food allergies 12/12/2020   Menopausal hot flushes 12/12/2020   Graves disease 11/05/2018   H/O radioactive iodine thyroid ablation 11/05/2018   DDD (degenerative disc disease), lumbosacral 08/18/2012    Past Surgical History:  Procedure Laterality Date   hystrectomy     SHOULDER SURGERY      OB History   No obstetric history on file.      Home Medications    Prior to Admission medications   Medication Sig Start Date End Date Taking? Authorizing Provider  Cholecalciferol (VITAMIN D3) 25 MCG (1000 UT) CAPS Take by mouth.   Yes [provider]  levothyroxine (SYNTHROID) 150 MCG tablet Take 1 tablet (150 mcg total) by mouth daily. 08/15/20  Yes Shamleffer, Konrad Dolores, MD  loratadine (CLARITIN) 10 MG tablet Take 10 mg by mouth daily.   Yes [provider]    Family History Family History  Problem Relation Age of Onset   Celiac disease Mother    Allergies Father     Social History Social History   Tobacco Use   Smoking status: Former    Types: Cigarettes    Quit date: 03/05/2018    Years since quitting: 3.3   Smokeless tobacco: Never  Vaping Use    Vaping Use: Never used  Substance Use Topics   Alcohol use: Yes    Alcohol/week: 2.0 - 4.0 standard drinks    Types: 2 - 4 Shots of liquor per week   Drug use: Never     Allergies   Articaine-epinephrine, Prednisone, Dexamethasone, Tramadol, Elavil  [amitriptyline hcl], Propylene glycol, and Soy allergy   Review of Systems Review of Systems See HPI  Physical Exam Triage Vital Signs ED Triage Vitals  Enc Vitals Group     BP 07/16/21 0903 (!) 135/91     Pulse Rate 07/16/21 0903 (!) 110     Resp 07/16/21 0903 18     Temp 07/16/21 0903 98.9 F (37.2 C)     Temp Source 07/16/21 0903 Oral     SpO2 07/16/21 0903 100 %     Weight 07/16/21 0904 160 lb (72.6 kg)     Height 07/16/21 0904 5\' 3"  (1.6 m)     Head Circumference --      Peak Flow --      Pain Score 07/16/21 0903 3     Pain Loc --      Pain Edu? --      Excl. in GC? --    No  data found.  Updated Vital Signs BP (!) 135/91 (BP Location: Right Arm)   Pulse (!) 110   Temp 98.9 F (37.2 C) (Oral)   Resp 18   Ht 5\' 3"  (1.6 m)   Wt 72.6 kg   SpO2 100%   BMI 28.34 kg/m      Physical Exam Constitutional:      General: She is not in acute distress.    Appearance: She is well-developed and normal weight. She is ill-appearing.  HENT:     Head: Normocephalic and atraumatic.     Right Ear: Tympanic membrane, ear canal and external ear normal.     Left Ear: Tympanic membrane, ear canal and external ear normal.     Nose: Nose normal. No congestion.     Mouth/Throat:     Mouth: Mucous membranes are moist.     Pharynx: Posterior oropharyngeal erythema present.  Eyes:     Conjunctiva/sclera: Conjunctivae normal.     Pupils: Pupils are equal, round, and reactive to light.  Cardiovascular:     Rate and Rhythm: Regular rhythm. Tachycardia present.     Heart sounds: Normal heart sounds.  Pulmonary:     Effort: Pulmonary effort is normal. No respiratory distress.     Breath sounds: Normal breath sounds.  Abdominal:      General: There is no distension.     Palpations: Abdomen is soft.  Musculoskeletal:        General: Normal range of motion.     Cervical back: Normal range of motion.  Lymphadenopathy:     Cervical: No cervical adenopathy.  Skin:    General: Skin is warm and dry.  Neurological:     Mental Status: She is alert.  Psychiatric:        Mood and Affect: Mood normal.        Behavior: Behavior normal.     UC Treatments / Results  Labs (all labs ordered are listed, but only abnormal results are displayed) Labs Reviewed  COVID-19, FLU A+B AND RSV    EKG   Radiology No results found.  Procedures Procedures (including critical care time)  Medications Ordered in UC Medications - No data to display  Initial Impression / Assessment and Plan / UC Course  I have reviewed the triage vital signs and the nursing notes.  Pertinent labs & imaging results that were available during my care of the patient were reviewed by me and considered in my medical decision making (see chart for details).     I told patient to stay home from traveling this week.  She has test pending.  Need to check for flu RSV and COVID Final Clinical Impressions(s) / UC Diagnoses   Final diagnoses:  Flu-like symptoms     Discharge Instructions      Stay home and rest next week May take OTC cough and cold medicine Tylenol or ibuprofen for pain or fever Drink lots of fluids Your results will be available on my Chart   ED Prescriptions   None    PDMP not reviewed this encounter.   June, MD 07/16/21 [phone]

## 2021-07-16 NOTE — ED Triage Notes (Signed)
Patient states that she's had a cough, low grade fever, irritated throat x 2 days.  Patient has been taken Aleve, Vitamin C, Zinc.  Patient is not vaccinated for COVID.

## 2021-07-17 LAB — COVID-19, FLU A+B AND RSV
Influenza A, NAA: NOT DETECTED
Influenza B, NAA: NOT DETECTED
RSV, NAA: NOT DETECTED
SARS-CoV-2, NAA: DETECTED — AB

## 2021-08-03 ENCOUNTER — Ambulatory Visit: Payer: Federal, State, Local not specified - PPO | Admitting: Family Medicine

## 2021-08-12 ENCOUNTER — Other Ambulatory Visit: Payer: Self-pay | Admitting: Internal Medicine

## 2021-08-21 ENCOUNTER — Ambulatory Visit (INDEPENDENT_AMBULATORY_CARE_PROVIDER_SITE_OTHER): Payer: Federal, State, Local not specified - PPO | Admitting: Internal Medicine

## 2021-08-21 ENCOUNTER — Encounter: Payer: Self-pay | Admitting: Internal Medicine

## 2021-08-21 ENCOUNTER — Other Ambulatory Visit: Payer: Self-pay

## 2021-08-21 VITALS — BP 124/70 | HR 83 | Ht 63.0 in | Wt 161.0 lb

## 2021-08-21 DIAGNOSIS — Z23 Encounter for immunization: Secondary | ICD-10-CM | POA: Diagnosis not present

## 2021-08-21 DIAGNOSIS — E890 Postprocedural hypothyroidism: Secondary | ICD-10-CM | POA: Diagnosis not present

## 2021-08-21 LAB — T4, FREE: Free T4: 1.25 ng/dL (ref 0.60–1.60)

## 2021-08-21 LAB — TSH: TSH: 1.04 u[IU]/mL (ref 0.35–5.50)

## 2021-08-21 MED ORDER — LEVOTHYROXINE SODIUM 150 MCG PO TABS: 150.0000 ug | ORAL_TABLET | Freq: Every day | ORAL | 3 refills | Status: DC

## 2021-08-21 NOTE — Patient Instructions (Signed)

## 2021-08-21 NOTE — Progress Notes (Signed)
Name: Melanie Travis  MRN/ DOB: [id number], 07/15/1971    Age/ Sex: 50 y.o., female    PCP: Willow Ora, MD   Reason for Endocrinology Evaluation: Hyperthyroidism     Date of Initial Endocrinology Evaluation: 11/05/2018    HPI: [name] is a 50 y.o. female with a past medical history of Lumbosacral DDD and  Asthma . The patient presented for initial endocrinology clinic visit on 11/05/2018 for consultative assistance with her hyperthyroidism.    HISTORICAL SUMMARY:  She was seen by [facility] Endocrinology in May, 2019 for hyperthyroidism. She was having palpitations, weight loss and tremors for 6 months prior to her presentation. She had a TSH of 0.029 uIU/mL with elevated FT4 3.7 ug/dL    She had an Uptake and scan that showed diffusely hyperfunctioning thyroid gland due to graves' disease., 24-hr uptake was 78 % with elevated TSI and TRAb levels.   She quit smoking in May, 2019  No B-blockers due to asthma    She was started initially on Methimazole in May, 2019 ,but due to persistent hyperthyroidism, she underwent RAI ablation on 07/31/18 with 15.2 mCi of 131- RAI    Moved from [place], [place], works for [employer].    In February, 2020 she was started on LT-4 replacement due to an elevated TSH of 76.61 uIU/mL   SUBJECTIVE:    Today (08/15/2020):  Ms. Bujak is here for a follow up on post-ablative hypothyroidism .    Weight fluctuates  Denies constipation or diarrhea - takes magnesium when needed  She denies palpitations but has anxiety  No local neck symptoms    Levothyroxine 150 mcg daily     HISTORY:  Past Medical History: Asthma, Allergies, graves' disease. L-S DDD.  Past Surgical History:  Past Surgical History:  Procedure Laterality Date   hystrectomy     SHOULDER SURGERY      Social History:  reports that she quit smoking about 3 years ago. Her smoking use included cigarettes. She has never used smokeless tobacco. She reports  current alcohol use of about 2.0 - 4.0 standard drinks per week. She reports that she does not use drugs. Family History: family history includes Allergies in her father; Celiac disease in her mother.   HOME MEDICATIONS: Allergies as of 08/21/2021       Reactions   Articaine-epinephrine Anaphylaxis   Prednisone Other (See Comments)   "makes me want to kill people"  And a rash "makes me want to kill people"  And a rash "makes me want to kill people"   Dexamethasone Other (See Comments)   Pressure in ears, rash Pressure in ears, rash   Tramadol    Elavil  [amitriptyline Hcl] Rash   Propylene Glycol Itching, Rash   Soy Allergy Itching, Rash   Blisters in mouth Blisters in mouth        Medication List        Accurate as of August 21, 2021  9:22 AM. If you have any questions, ask your nurse or doctor.          levothyroxine 150 MCG tablet Commonly known as: SYNTHROID TAKE 1 TABLET(150 MCG) BY MOUTH DAILY   loratadine 10 MG tablet Commonly known as: CLARITIN Take 10 mg by mouth daily.   Vitamin D3 25 MCG (1000 UT) Caps Take by mouth.          REVIEW OF SYSTEMS: A comprehensive ROS was conducted with the patient and is negative except  as per HPI and below:  Review of Systems  Constitutional:  Positive for weight loss.  Eyes:  Negative for pain and discharge.  Respiratory:         Allergy related.   Cardiovascular:  Negative for chest pain and palpitations.  Gastrointestinal:  Negative for constipation and diarrhea.  Skin: Negative.   Psychiatric/Behavioral:  Negative for depression. The patient is not nervous/anxious.       OBJECTIVE:  VS: BP 124/70 (BP Location: Left Arm, Patient Position: Sitting, Cuff Size: Small)   Pulse 83   Ht 5\' 3"  (1.6 m)   Wt 161 lb (73 kg)   SpO2 99%   BMI 28.52 kg/m    Wt Readings from Last 3 Encounters:  08/21/21 161 lb (73 kg)  07/16/21 160 lb (72.6 kg)  12/12/20 155 lb 3.2 oz (70.4 kg)     EXAM: General: Pt  appears well and is in NAD  Neck: General: Supple without adenopathy. Thyroid: Thyroid size normal.  No goiter or nodules appreciated.  Lungs: Clear with good BS bilat with no rales, rhonchi, or wheezes  Heart: Auscultation: RRR.  Abdomen: Normoactive bowel sounds, soft, nontender, without masses or organomegaly palpable  Extremities:  BL LE: No pretibial edema normal ROM and strength.  Mental Status: Judgment, insight: Intact Orientation: Oriented to time, place, and person Mood and affect: No depression, anxiety, or agitation     DATA REVIEWED: Results for ANNELIES, COYT (MRN Melanie Travis) as of 08/21/2021 14:48  Ref. Range 08/21/2021 09:42  TSH Latest Ref Range: 0.35 - 5.50 uIU/mL 1.04  T4,Free(Direct) Latest Ref Range: 0.60 - 1.60 ng/dL 08/23/2021     ASSESSMENT/PLAN/RECOMMENDATIONS:   1. Post-Ablative Hypothyroidism:    - She is clinically euthyroid  - No local neck symptoms  - Repeat TSh is normal   Medications   Continue Levothyroxine  150  mcg daily    2. Graves' Disease:   - No extrathyroidal manifestations of graves' disease   Pt would like to transfer hypothyroid care to her PCP as she is trying to minimize office visit, I am ok with this.   Signed electronically by: 0.62, MD  [facility] Endocrinology  [facility] [address] [place], [place] Phone: [phone] FAX: [phone]   CC: [phone], MD [address] [place] Phone: [phone] Fax: [phone]   Return to Endocrinology clinic as below: Future Appointments  Date Time Provider Department Center  12/13/2021  9:00 AM 12/15/2021, MD [facility]

## 2021-11-09 DIAGNOSIS — M9901 Segmental and somatic dysfunction of cervical region: Secondary | ICD-10-CM | POA: Diagnosis not present

## 2021-11-09 DIAGNOSIS — M5412 Radiculopathy, cervical region: Secondary | ICD-10-CM | POA: Diagnosis not present

## 2021-11-13 DIAGNOSIS — M5412 Radiculopathy, cervical region: Secondary | ICD-10-CM | POA: Diagnosis not present

## 2021-11-13 DIAGNOSIS — M9901 Segmental and somatic dysfunction of cervical region: Secondary | ICD-10-CM | POA: Diagnosis not present

## 2021-11-16 ENCOUNTER — Other Ambulatory Visit: Payer: Self-pay | Admitting: Internal Medicine

## 2021-11-16 DIAGNOSIS — M5412 Radiculopathy, cervical region: Secondary | ICD-10-CM | POA: Diagnosis not present

## 2021-11-16 DIAGNOSIS — M9901 Segmental and somatic dysfunction of cervical region: Secondary | ICD-10-CM | POA: Diagnosis not present

## 2021-11-17 ENCOUNTER — Other Ambulatory Visit: Payer: Self-pay | Admitting: Family Medicine

## 2021-11-17 ENCOUNTER — Ambulatory Visit (INDEPENDENT_AMBULATORY_CARE_PROVIDER_SITE_OTHER): Payer: Federal, State, Local not specified - PPO | Admitting: Family Medicine

## 2021-11-17 ENCOUNTER — Encounter: Payer: Self-pay | Admitting: Family Medicine

## 2021-11-17 VITALS — BP 122/78 | HR 81 | Temp 98.7°F | Ht 63.0 in | Wt 155.5 lb

## 2021-11-17 DIAGNOSIS — R059 Cough, unspecified: Secondary | ICD-10-CM | POA: Diagnosis not present

## 2021-11-17 DIAGNOSIS — J111 Influenza due to unidentified influenza virus with other respiratory manifestations: Secondary | ICD-10-CM

## 2021-11-17 DIAGNOSIS — R52 Pain, unspecified: Secondary | ICD-10-CM | POA: Diagnosis not present

## 2021-11-17 LAB — POC COVID19 BINAXNOW: SARS Coronavirus 2 Ag: NEGATIVE

## 2021-11-17 MED ORDER — ALBUTEROL SULFATE HFA 108 (90 BASE) MCG/ACT IN AERS: 2.0000 | INHALATION_SPRAY | Freq: Four times a day (QID) | RESPIRATORY_TRACT | 0 refills | Status: DC | PRN

## 2021-11-17 MED ORDER — OSELTAMIVIR PHOSPHATE 75 MG PO CAPS: 75.0000 mg | ORAL_CAPSULE | Freq: Two times a day (BID) | ORAL | 0 refills | Status: DC

## 2021-11-17 NOTE — Patient Instructions (Signed)
Meds have been sent the the pharmacy °You can take tylenol for pain/fevers °If worsening symptoms, let us know or go to the Emergency room  ° ° °

## 2021-11-17 NOTE — Progress Notes (Signed)
Subjective:     Patient ID: Melanie Travis, female    DOB: 03-26-1971, 51 y.o.   MRN: [id number]  Chief Complaint  Patient presents with   Fever    Slight fever    Cough    Dry cough Sx started yesterday Took Nyquil and Dayquil    Headache   Generalized Body Aches    HPI Chief complaint: flu symptoms Symptom onset: 1/19 Pertinent positives: low fever, dry cough, ha, myalgias, chest tight(mild asthma) Pertinent negatives: no v/d Treatments tried: nyquil/dayquil Vaccine status: got flu shot Sick exposure: daughter dx flu - on tuesday  Health Maintenance Due  Topic Date Due   COVID-19 Vaccine (1) Never done   Zoster Vaccines- Shingrix (1 of 2) Never done   TETANUS/TDAP  11/15/2010   COLONOSCOPY (Pts 45-53yrs Insurance coverage will need to be confirmed)  Never done    Past Medical History:  Diagnosis Date   Allergy    Arthritis    Graves disease    Thyroid disease     Past Surgical History:  Procedure Laterality Date   hystrectomy     SHOULDER SURGERY      Outpatient Medications Prior to Visit  Medication Sig Dispense Refill   Cholecalciferol (VITAMIN D3) 25 MCG (1000 UT) CAPS Take by mouth.     levothyroxine (SYNTHROID) 150 MCG tablet TAKE 1 TABLET(150 MCG) BY MOUTH DAILY 90 tablet 3   loratadine (CLARITIN) 10 MG tablet Take 10 mg by mouth daily.     No facility-administered medications prior to visit.    Allergies  Allergen Reactions   Prednisone Other (See Comments)    "makes me want to kill people"  And a rash "makes me want to kill people"  And a rash  "makes me want to kill people"   Dexamethasone Other (See Comments)    Pressure in ears, rash Pressure in ears, rash    Tramadol    Elavil  [Amitriptyline Hcl] Rash   Propylene Glycol Itching and Rash   Soy Allergy Itching and Rash    Blisters in mouth Blisters in mouth    OTL:XBWIOMBT/[id number] except as noted in HPI      Objective:     BP 122/78    Pulse 81    Temp 98.7  F (37.1 C) (Temporal)    Ht 5\' 3"  (1.6 m)    Wt 155 lb 8 oz (70.5 kg)    SpO2 96%    BMI 27.55 kg/m  Wt Readings from Last 3 Encounters:  11/17/21 155 lb 8 oz (70.5 kg)  08/21/21 161 lb (73 kg)  07/16/21 160 lb (72.6 kg)        Gen: WDWN NAD HEENT: NCAT, conjunctiva not injected, sclera nonicteric NECK:  supple, no thyromegaly, no nodes, no carotid bruits CARDIAC: RRR, S1S2+, no murmur. DP 2+B LUNGS: CTAB. No wheezes EXT:  no edema MSK: no gross abnormalities.  NEURO: A&O x3.  CN II-XII intact.  PSYCH: normal mood. Good eye contact  Results for orders placed or performed in visit on 11/17/21  POC COVID-19  Result Value Ref Range   SARS Coronavirus 2 Ag Negative Negative     Assessment & Plan:   Problem List Items Addressed This Visit   None Visit Diagnoses     Cough, unspecified type    -  Primary   Relevant Orders   POC COVID-19 (Completed)   Generalized body aches       Relevant Orders   POC COVID-19 (  Completed)   Influenza         Influenza-presumptive as daughter +.  Will tx w/tamiflu.  Albuterol if needed  No orders of the defined types were placed in this encounter.   Angelena Sole, MD

## 2021-12-13 ENCOUNTER — Encounter: Payer: Self-pay | Admitting: Family Medicine

## 2021-12-13 ENCOUNTER — Ambulatory Visit (INDEPENDENT_AMBULATORY_CARE_PROVIDER_SITE_OTHER): Payer: Federal, State, Local not specified - PPO | Admitting: Family Medicine

## 2021-12-13 ENCOUNTER — Other Ambulatory Visit: Payer: Self-pay

## 2021-12-13 VITALS — BP 112/70 | HR 78 | Temp 98.2°F | Ht 63.0 in | Wt 155.8 lb

## 2021-12-13 DIAGNOSIS — E890 Postprocedural hypothyroidism: Secondary | ICD-10-CM | POA: Insufficient documentation

## 2021-12-13 DIAGNOSIS — N6489 Other specified disorders of breast: Secondary | ICD-10-CM | POA: Diagnosis not present

## 2021-12-13 DIAGNOSIS — N951 Menopausal and female climacteric states: Secondary | ICD-10-CM

## 2021-12-13 DIAGNOSIS — Z23 Encounter for immunization: Secondary | ICD-10-CM

## 2021-12-13 DIAGNOSIS — Z0000 Encounter for general adult medical examination without abnormal findings: Secondary | ICD-10-CM | POA: Diagnosis not present

## 2021-12-13 DIAGNOSIS — J4520 Mild intermittent asthma, uncomplicated: Secondary | ICD-10-CM

## 2021-12-13 HISTORY — DX: Postprocedural hypothyroidism: E89.0

## 2021-12-13 HISTORY — DX: Other specified disorders of breast: N64.89

## 2021-12-13 LAB — CBC WITH DIFFERENTIAL/PLATELET
Basophils Absolute: 0 10*3/uL (ref 0.0–0.1)
Basophils Relative: 0.6 % (ref 0.0–3.0)
Eosinophils Absolute: 0.1 10*3/uL (ref 0.0–0.7)
Eosinophils Relative: 1.6 % (ref 0.0–5.0)
HCT: 41 % (ref 36.0–46.0)
Hemoglobin: 13.7 g/dL (ref 12.0–15.0)
Lymphocytes Relative: 28.8 % (ref 12.0–46.0)
Lymphs Abs: 1.5 10*3/uL (ref 0.7–4.0)
MCHC: 33.4 g/dL (ref 30.0–36.0)
MCV: 84.2 fl (ref 78.0–100.0)
Monocytes Absolute: 0.4 10*3/uL (ref 0.1–1.0)
Monocytes Relative: 8.2 % (ref 3.0–12.0)
Neutro Abs: 3.1 10*3/uL (ref 1.4–7.7)
Neutrophils Relative %: 60.8 % (ref 43.0–77.0)
Platelets: 252 10*3/uL (ref 150.0–400.0)
RBC: 4.87 Mil/uL (ref 3.87–5.11)
RDW: 13.3 % (ref 11.5–15.5)
WBC: 5.1 10*3/uL (ref 4.0–10.5)

## 2021-12-13 LAB — LIPID PANEL
Cholesterol: 259 mg/dL — ABNORMAL HIGH (ref 0–200)
HDL: 48.4 mg/dL (ref >39.00)
NonHDL: 210.39
Total CHOL/HDL Ratio: 5
Triglycerides: 213 mg/dL — ABNORMAL HIGH (ref 0.0–149.0)
VLDL: 42.6 mg/dL — ABNORMAL HIGH (ref 0.0–40.0)

## 2021-12-13 LAB — TSH: TSH: 0.96 u[IU]/mL (ref 0.35–5.50)

## 2021-12-13 LAB — COMPREHENSIVE METABOLIC PANEL
ALT: 31 U/L (ref 0–35)
AST: 25 U/L (ref 0–37)
Albumin: 4.8 g/dL (ref 3.5–5.2)
Alkaline Phosphatase: 87 U/L (ref 39–117)
BUN: 22 mg/dL (ref 6–23)
CO2: 26 mEq/L (ref 19–32)
Calcium: 10 mg/dL (ref 8.4–10.5)
Chloride: 102 mEq/L (ref 96–112)
Creatinine, Ser: 0.79 mg/dL (ref 0.40–1.20)
GFR: 86.26 mL/min (ref >60.00)
Glucose, Bld: 112 mg/dL — ABNORMAL HIGH (ref 70–99)
Potassium: 3.9 mEq/L (ref 3.5–5.1)
Sodium: 140 mEq/L (ref 135–145)
Total Bilirubin: 0.4 mg/dL (ref 0.2–1.2)
Total Protein: 7.7 g/dL (ref 6.0–8.3)

## 2021-12-13 NOTE — Addendum Note (Signed)
Addended by: Candie Chroman on: 12/13/2021 09:44 AM   Modules accepted: Orders

## 2021-12-13 NOTE — Patient Instructions (Signed)
Please return in 12 months for your annual complete physical; please come fasting. You may schedule a nurse visit in 2-6 months to receive your 2nd Shingrix vaccination.   Today you were given shingrix and Tdap vaccinations.   I will release your lab results to you on your MyChart account with further instructions. You may see the results before I do, but when I review them I will send you a message with my report or have my assistant call you if things need to be discussed. Please reply to my message with any questions. Thank you!   If you have any questions or concerns, please don't hesitate to send me a message via MyChart or call the office at [phone]. Thank you for visiting with [place] today! It's our pleasure caring for you.   Please do these things to maintain good health!  Exercise at least 30-45 minutes a day,  4-5 days a week.  Eat a low-fat diet with lots of fruits and vegetables, up to 7-9 servings per day. Drink plenty of water daily. Try to drink 8 8oz glasses per day. Seatbelts can save your life. Always wear your seatbelt. Place Smoke Detectors on every level of your home and check batteries every year. Schedule an appointment with an eye doctor for an eye exam every 1-2 years Safe sex - use condoms to protect yourself from STDs if you could be exposed to these types of infections. Use birth control if you do not want to become pregnant and are sexually active. Avoid heavy alcohol use. If you drink, keep it to less than 2 drinks/day and not every day. Health Care Power of Attorney.  Choose someone you trust that could speak for you if you became unable to speak for yourself. Depression is common in our stressful world.If you're feeling down or losing interest in things you normally enjoy, please come in for a visit. If anyone is threatening or hurting you, please get help. Physical or Emotional Violence is never OK.

## 2021-12-13 NOTE — Progress Notes (Signed)
Subjective  Chief Complaint  Patient presents with   Annual Exam    Fasting    HPI: Melanie Travis is a 51 y.o. female who presents to [facility] today for a Female Wellness Visit. She also has the concerns and/or needs as listed above in the chief complaint. These will be addressed in addition to the Health Maintenance Visit.   Wellness Visit: annual visit with health maintenance review and exam without Pap  Health maintenance: Reviewed mammogram, s/p biopsy showing complex sclerosing lesion of the right breast.  Has follow-up scheduled.  Recovered well.  Negative Cologuard last year.  Eligible for Shingrix and Tdap today.  Other immunizations are current.  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  Walks a lot at her job.  Works 12-hour shifts. Chronic disease f/u and/or acute problem visit: (deemed necessary to be done in addition to the wellness visit): Overweight: Would like to lose weight.  Discussed diet and exercise. Postoperative hypothyroidism managed by endocrine.  Reviewed note.  TSH was normal in November.  Would like me to take over management. Multiple food allergies and mild intermittent asthma: No flares.   Assessment  1. Annual physical exam   2. Complex sclerosing lesion of breast 2022   3. Postablative hypothyroidism   4. Mild intermittent asthma without complication      Plan  Female Wellness Visit: Age appropriate Health Maintenance and Prevention measures were discussed with patient. Included topics are cancer screening recommendations, ways to keep healthy (see AVS) including dietary and exercise recommendations, regular eye and dental care, use of seat belts, and avoidance of moderate alcohol use and tobacco use.  Screens are current BMI: discussed patient's BMI and encouraged positive lifestyle modifications to help get to or maintain a target BMI. HM needs and immunizations were addressed and ordered. See  below for orders. See HM and immunization section for updates.  Shingrix education given.  First Shingrix given today.  Tdap updated Routine labs and screening tests ordered including cmp, cbc and lipids where appropriate. Discussed recommendations regarding Vit D and calcium supplementation (see AVS)  Chronic disease management visit and/or acute problem visit: Hypothyroidism well-controlled.  Recheck TSH today.  Continue current dose of Synthroid. Discussed diet and exercise.  Recommend strength training.  Follow up:  12 months for complete physical and follow-up thyroid Orders Placed This Encounter  Procedures   CBC with Differential/Platelet   Comprehensive metabolic panel   Lipid panel   No orders of the defined types were placed in this encounter.     Body mass index is 27.6 kg/m. Wt Readings from Last 3 Encounters:  12/13/21 155 lb 12.8 oz (70.7 kg)  11/17/21 155 lb 8 oz (70.5 kg)  08/21/21 161 lb (73 kg)     Patient Active Problem List   Diagnosis Date Noted   Complex sclerosing lesion of breast 2022 12/13/2021   Postablative hypothyroidism 12/13/2021    Endocrine manages    Mild intermittent asthma 12/12/2020   Multiple food allergies 12/12/2020   Menopausal hot flushes 12/12/2020   History of Graves' disease 11/05/2018   H/O radioactive iodine thyroid ablation 11/05/2018   DDD (degenerative disc disease), lumbosacral 08/18/2012   Health Maintenance  Topic Date Due   Zoster Vaccines- Shingrix (1 of 2) Never done   TETANUS/TDAP  11/15/2010   COVID-19 Vaccine (1) 01/17/2022 (Originally 06/12/1971)   MAMMOGRAM  05/05/2022   Fecal DNA (Cologuard)  02/18/2024   INFLUENZA  VACCINE  Completed   Hepatitis C Screening  Completed   HIV Screening  Completed   HPV VACCINES  Aged Out   Immunization History  Administered Date(s) Administered   Influenza,inj,Quad PF,6+ Mos 09/15/2018, 08/11/2019, 08/15/2020, 08/21/2021   Tdap 11/15/2000   We updated and reviewed the  patient's past history in detail and it is documented below. Allergies: Patient is allergic to prednisone, dexamethasone, tramadol, elavil  [amitriptyline hcl], propylene glycol, and soy allergy. Past Medical History Patient  has a past medical history of Allergy, Arthritis, Complex sclerosing lesion of breast 2022 (12/13/2021), Graves disease, Postablative hypothyroidism (12/13/2021), and Thyroid disease. Past Surgical History Patient  has a past surgical history that includes hystrectomy and Shoulder surgery. Family History: Patient family history includes Allergies in her father; Celiac disease in her mother. Social History:  Patient  reports that she quit smoking about 3 years ago. Her smoking use included cigarettes. She has never used smokeless tobacco. She reports current alcohol use of about 2.0 - 4.0 standard drinks per week. She reports that she does not use drugs.  Review of Systems: Constitutional: negative for fever or malaise Ophthalmic: negative for photophobia, double vision or loss of vision Cardiovascular: negative for chest pain, dyspnea on exertion, or new LE swelling Respiratory: negative for SOB or persistent cough Gastrointestinal: negative for abdominal pain, change in bowel habits or melena Genitourinary: negative for dysuria or gross hematuria, no abnormal uterine bleeding or disharge Musculoskeletal: negative for new gait disturbance or muscular weakness Integumentary: negative for new or persistent rashes, no breast lumps Neurological: negative for TIA or stroke symptoms Psychiatric: negative for SI or delusions Allergic/Immunologic: negative for hives  Patient Care Team    Relationship Specialty Notifications Start End  Leamon Arnt, MD PCP - General Family Medicine  12/12/20   Shamleffer, Melanie Crazier, MD Consulting Physician Endocrinology  12/13/21   Rolm Bookbinder, MD Consulting Physician General Surgery  12/13/21     Objective  Vitals: BP 112/70     Pulse 78    Temp 98.2 F (36.8 C) (Temporal)    Ht 5\' 3"  (1.6 m)    Wt 155 lb 12.8 oz (70.7 kg)    SpO2 97%    BMI 27.60 kg/m  General:  Well developed, well nourished, no acute distress  Psych:  Alert and orientedx3,normal mood and affect HEENT:  Normocephalic, atraumatic, non-icteric sclera,  supple neck without adenopathy, mass or thyromegaly Cardiovascular:  Normal S1, S2, RRR without gallop, rub or murmur Respiratory:  Good breath sounds bilaterally, CTAB with normal respiratory effort Gastrointestinal: normal bowel sounds, soft, non-tender, no noted masses. No HSM MSK: no deformities, contusions. Joints are without erythema or swelling.  Skin:  Warm, no rashes or suspicious lesions noted Neurologic:    Mental status is normal. CN 2-11 are normal. Gross motor and sensory exams are normal. Normal gait. No tremor   Commons side effects, risks, benefits, and alternatives for medications and treatment plan prescribed today were discussed, and the patient expressed understanding of the given instructions. Patient is instructed to call or message via MyChart if he/she has any questions or concerns regarding our treatment plan. No barriers to understanding were identified. We discussed Red Flag symptoms and signs in detail. Patient expressed understanding regarding what to do in case of urgent or emergency type symptoms.  Medication list was reconciled, printed and provided to the patient in AVS. Patient instructions and summary information was reviewed with the patient as documented in the AVS. This note was  prepared with assistance of Systems analyst. Occasional wrong-word or sound-a-like substitutions may have occurred due to the inherent limitations of voice recognition software  This visit occurred during the SARS-CoV-2 public health emergency.  Safety protocols were in place, including screening questions prior to the visit, additional usage of staff PPE, and extensive  cleaning of exam room while observing appropriate contact time as indicated for disinfecting solutions.

## 2021-12-14 LAB — LDL CHOLESTEROL, DIRECT: Direct LDL: 158 mg/dL

## 2021-12-15 ENCOUNTER — Other Ambulatory Visit: Payer: Self-pay | Admitting: General Surgery

## 2021-12-15 DIAGNOSIS — N6489 Other specified disorders of breast: Secondary | ICD-10-CM

## 2022-01-12 ENCOUNTER — Other Ambulatory Visit: Payer: Self-pay | Admitting: General Surgery

## 2022-01-12 ENCOUNTER — Ambulatory Visit
Admission: RE | Admit: 2022-01-12 | Discharge: 2022-01-12 | Disposition: A | Discharge status: Home / Self Care | Payer: Federal, State, Local not specified - PPO | Source: Ambulatory Visit | Attending: General Surgery | Admitting: General Surgery

## 2022-01-12 DIAGNOSIS — R922 Inconclusive mammogram: Secondary | ICD-10-CM | POA: Diagnosis not present

## 2022-01-12 DIAGNOSIS — N6489 Other specified disorders of breast: Secondary | ICD-10-CM

## 2022-02-21 ENCOUNTER — Ambulatory Visit (INDEPENDENT_AMBULATORY_CARE_PROVIDER_SITE_OTHER): Payer: Federal, State, Local not specified - PPO | Admitting: *Deleted

## 2022-02-21 DIAGNOSIS — Z23 Encounter for immunization: Secondary | ICD-10-CM

## 2022-02-21 NOTE — Progress Notes (Signed)
Per orders of Dr. Mardelle Matte, injection of 2nd Shingles Vaccine given in left deltoid per patient preference by Jobe Gibbon, CMA. Patient tolerated injection well.  ? ?Jobe Gibbon, CMA ? ?

## 2022-04-12 DIAGNOSIS — N6489 Other specified disorders of breast: Secondary | ICD-10-CM | POA: Diagnosis not present

## 2022-05-29 ENCOUNTER — Ambulatory Visit
Admission: RE | Admit: 2022-05-29 | Discharge: 2022-05-29 | Disposition: A | Discharge status: Home / Self Care | Source: Ambulatory Visit | Attending: General Surgery | Admitting: General Surgery

## 2022-05-29 DIAGNOSIS — R922 Inconclusive mammogram: Secondary | ICD-10-CM | POA: Diagnosis not present

## 2022-05-29 DIAGNOSIS — N6489 Other specified disorders of breast: Secondary | ICD-10-CM

## 2022-07-23 ENCOUNTER — Encounter: Payer: Self-pay | Admitting: *Deleted

## 2022-10-11 ENCOUNTER — Encounter: Payer: Self-pay | Admitting: *Deleted

## 2022-10-26 IMAGING — MG MM BREAST BX W/ LOC DEV 1ST LESION IMAGE BX SPEC STEREO GUIDE*R*
8 of 10 series · 8 of 22 positions shown · non-contrast
Comparison: Previous exams.
COMPARISON: Previous exams.

Addendum:
CLINICAL DATA: Patient with indeterminate right breast
calcifications.

EXAM:
RIGHT BREAST STEREOTACTIC CORE NEEDLE BIOPSY

[R (1 of 6)]
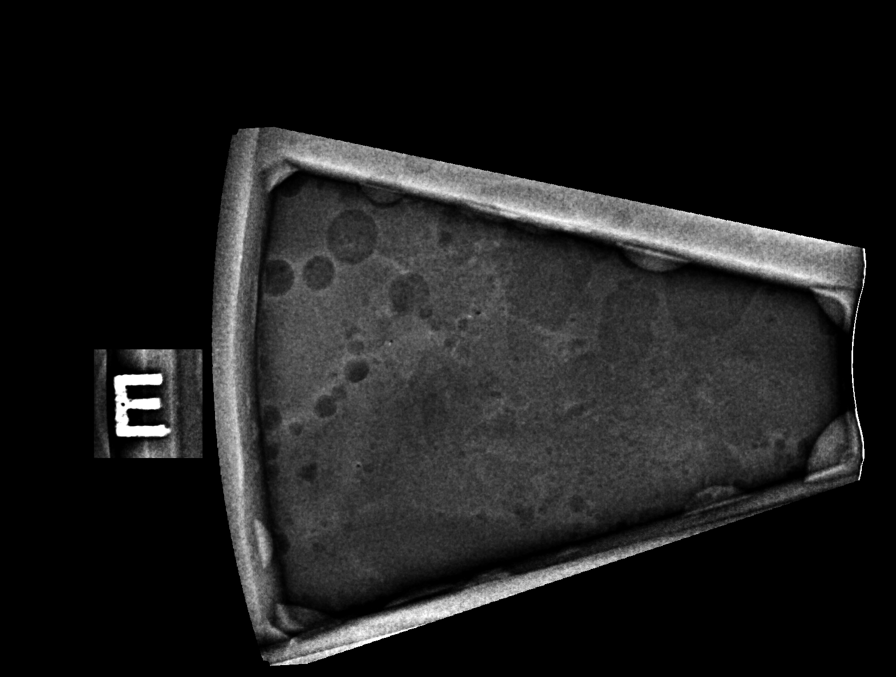

[R (2 of 6)]
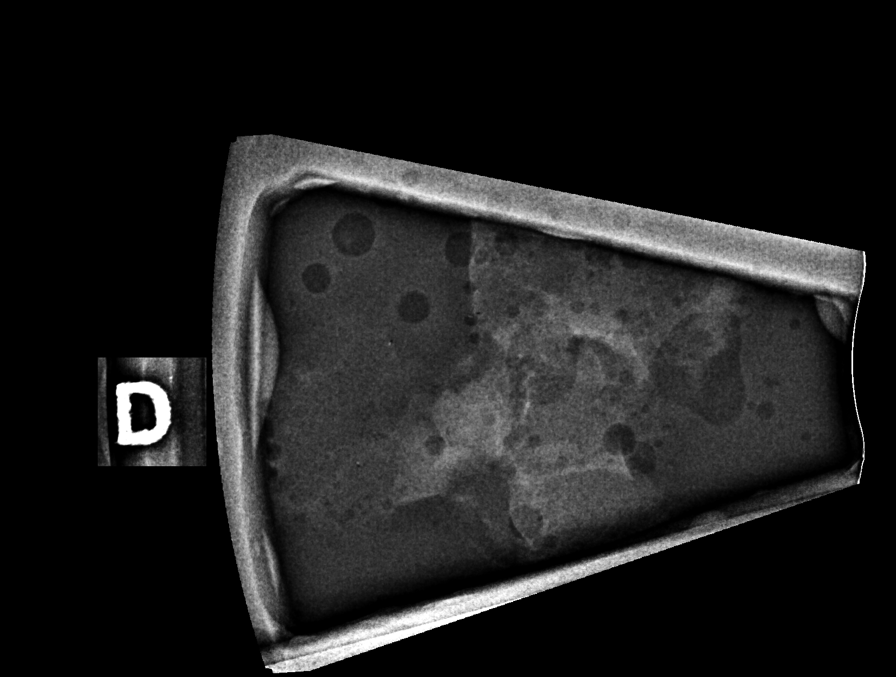

[R (3 of 6)]
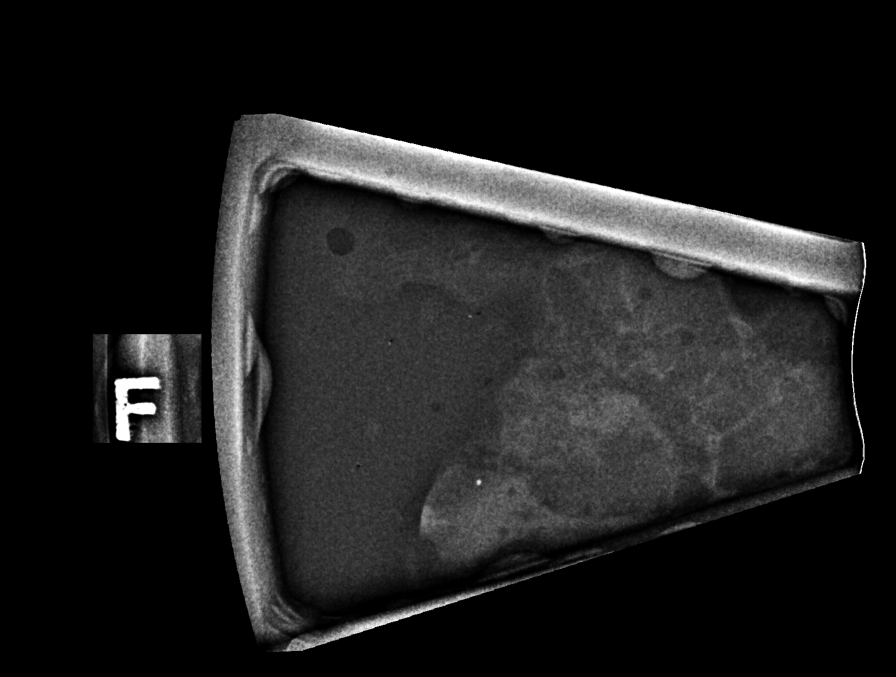

[R (4 of 6)]
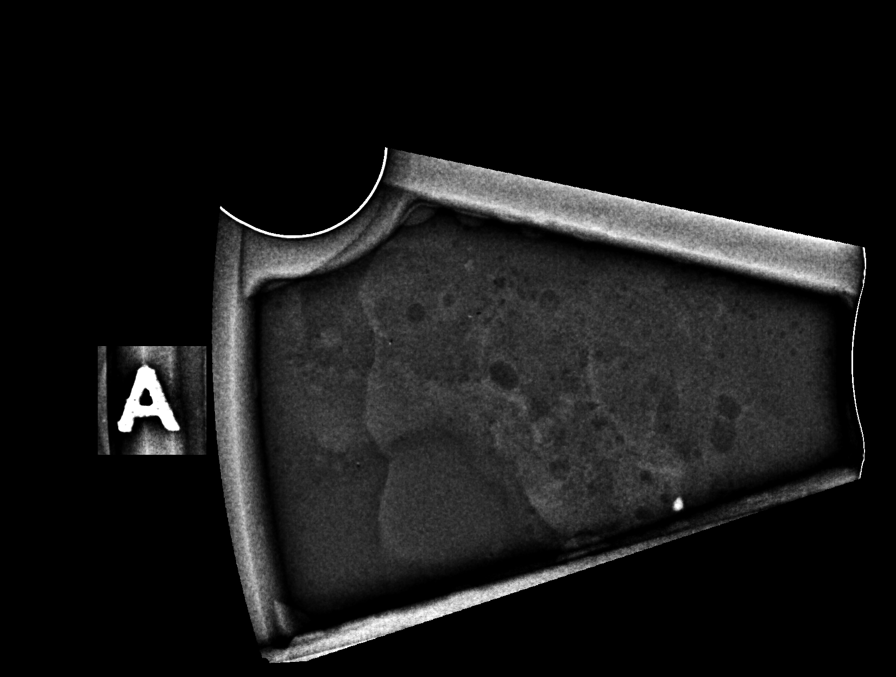

[R (5 of 6)]
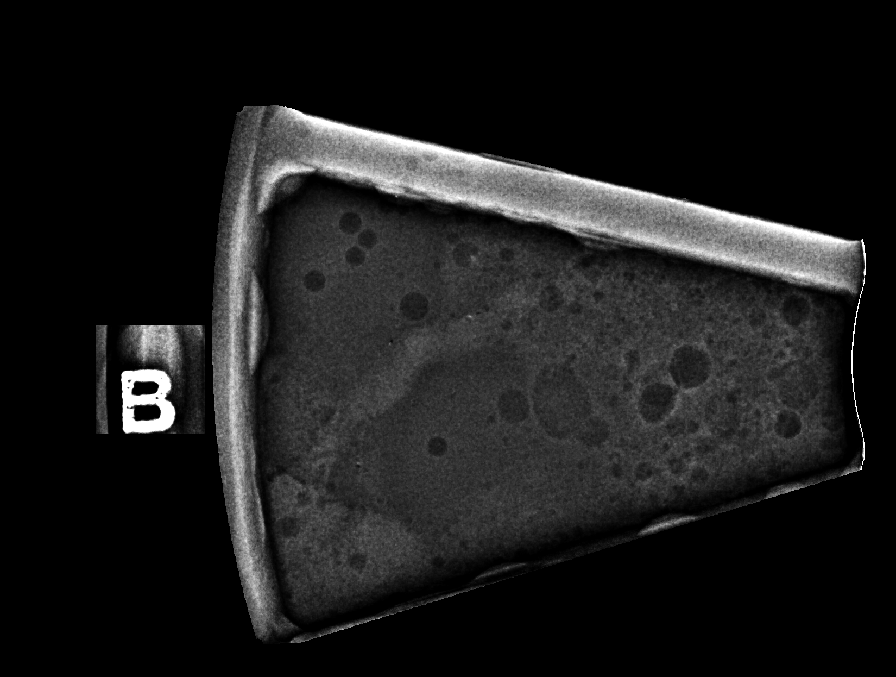

[R (6 of 6)]
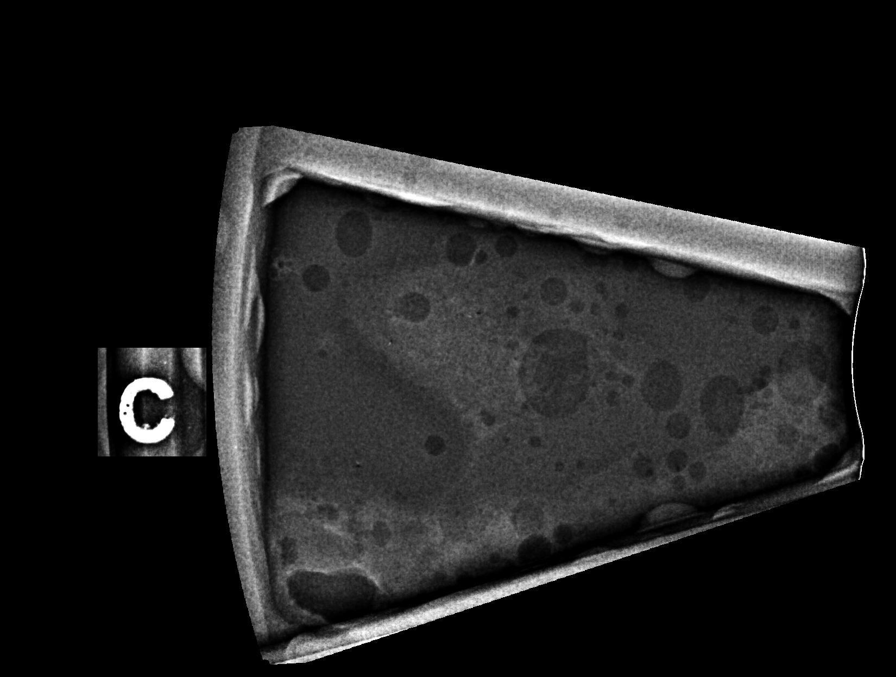

[R CC]
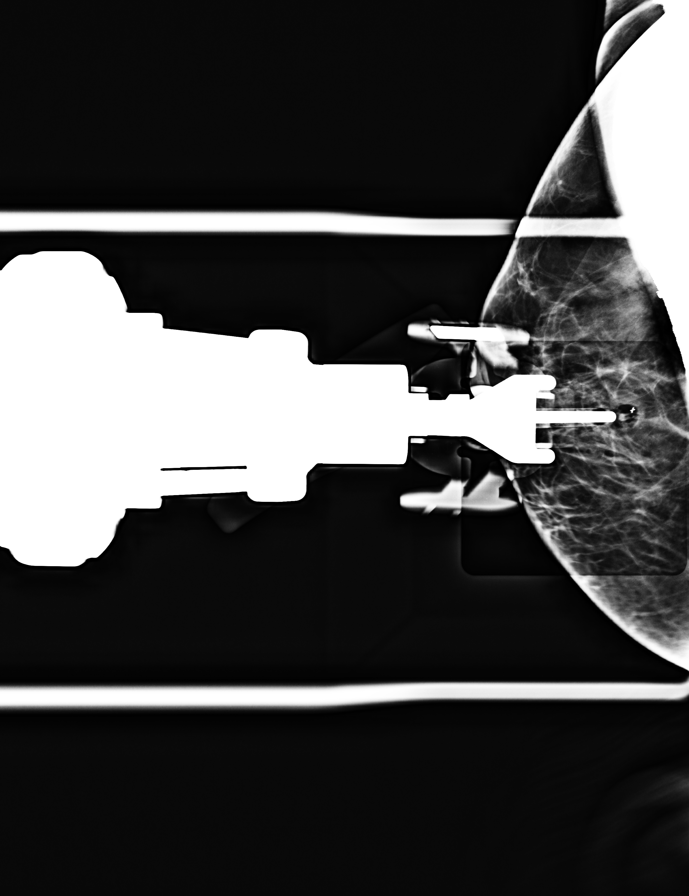

[R CC tomo · tomo slice 31/61.0]
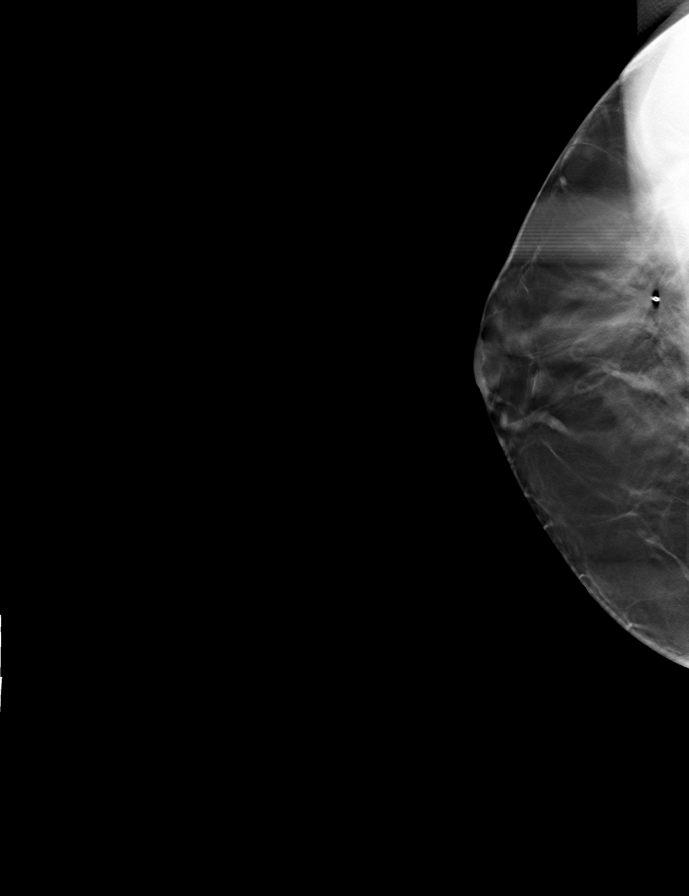

[8 of 22 positions shown; findings below may reference images not displayed]



Using sterile technique and 1% Lidocaine as local anesthetic, under
stereotactic guidance, a 9 gauge vacuum assisted device was used to
perform core needle biopsy of calcifications medial right breast
using a cranial approach. Specimen radiograph was performed showing
calcifications. Specimens with calcifications are identified for
pathology.

Lesion quadrant: Lower inner quadrant

At the conclusion of the procedure, X shaped tissue marker clip was
deployed into the biopsy cavity. Follow-up 2-view mammogram was
performed and dictated separately.
IMPRESSION: Stereotactic-guided biopsy of right breast calcifications. No
apparent complications.

ADDENDUM:
Pathology revealed COMPLEX SCLEROSING LESION WITH USUAL DUCTAL
HYPERPLASIA AND CALCIFICATIONS of the RIGHT breast, medial, X clip.
This was found to be concordant by Dr. Dulce Karina Gloria, with surgical
consultation for consideration of excision recommended.

Pathology results were discussed with the patient by telephone. The
patient reported doing well after the biopsy with tenderness at the
site. Post biopsy instructions and care were reviewed and questions
were answered. The patient was encouraged to call The [REDACTED]

Surgical consultation has been arranged with Dr. Kirenia Brandt
at [REDACTED] on June 30, 2021.

Pathology results reported by Cyntia Schell RN on 06/22/2021.



Using sterile technique and 1% Lidocaine as local anesthetic, under
stereotactic guidance, a 9 gauge vacuum assisted device was used to
perform core needle biopsy of calcifications medial right breast
using a cranial approach. Specimen radiograph was performed showing
calcifications. Specimens with calcifications are identified for
pathology.

Lesion quadrant: Lower inner quadrant

At the conclusion of the procedure, X shaped tissue marker clip was
deployed into the biopsy cavity. Follow-up 2-view mammogram was
performed and dictated separately.
IMPRESSION: Stereotactic-guided biopsy of right breast calcifications. No
apparent complications.

## 2022-11-06 ENCOUNTER — Other Ambulatory Visit: Payer: Self-pay | Admitting: Internal Medicine

## 2022-11-13 ENCOUNTER — Other Ambulatory Visit: Payer: Self-pay

## 2022-11-13 ENCOUNTER — Telehealth: Payer: Self-pay | Admitting: Family Medicine

## 2022-11-13 MED ORDER — ALBUTEROL SULFATE HFA 108 (90 BASE) MCG/ACT IN AERS: 2.0000 | INHALATION_SPRAY | Freq: Four times a day (QID) | RESPIRATORY_TRACT | 0 refills | Status: DC | PRN

## 2022-11-13 NOTE — Telephone Encounter (Signed)
  Encourage patient to contact the pharmacy for refills or they can request refills through [place]:  Please schedule appointment if longer than 1 year  NEXT APPOINTMENT DATE:  MEDICATION:  albuterol (VENTOLIN HFA) 108 (90 Base) MCG/ACT inhaler   Is the patient out of medication? YES  PHARMACY:  [id number] - [place], [place] Phone: [phone]  Fax: [phone]      Let patient know to contact pharmacy at the end of the day to make sure medication is ready.  Please notify patient to allow 48-72 hours to process

## 2022-11-16 ENCOUNTER — Other Ambulatory Visit: Payer: Self-pay | Admitting: *Deleted

## 2022-11-16 ENCOUNTER — Other Ambulatory Visit: Payer: Self-pay

## 2022-11-16 ENCOUNTER — Telehealth: Payer: Self-pay | Admitting: Family Medicine

## 2022-11-16 DIAGNOSIS — E0500 Thyrotoxicosis with diffuse goiter without thyrotoxic crisis or storm: Secondary | ICD-10-CM

## 2022-11-16 MED ORDER — LEVOTHYROXINE SODIUM 150 MCG PO TABS
ORAL_TABLET | ORAL | 3 refills | Status: DC
Start: 2022-11-16 — End: 2023-11-05

## 2022-11-16 MED ORDER — LEVOTHYROXINE SODIUM 150 MCG PO TABS: ORAL_TABLET | ORAL | 3 refills | Status: DC

## 2022-11-16 NOTE — Telephone Encounter (Addendum)
Patient states she had requested a RX for Levothyroxine 11/13/22 but the RX that was sent was for Albuterol.  Patient states Levothyroxine as previously prescribed by Endocrinologist but switched her medications to Dr. Jonni Sanger as her PCP.  Requests the following RX be sent asap:   LAST APPOINTMENT DATE:  12/13/22  NEXT APPOINTMENT DATE: 11/19/22  MEDICATION: levothyroxine (SYNTHROID) 150 MCG tablet   Is the patient out of medication? Yes-since 11/13/22  PHARMACY:  [id number] - [place], [place] Phone: [phone]  Fax: [phone]      Let patient know to contact pharmacy at the end of the day to make sure medication is ready.  Please notify patient to allow 48-72 hours to process

## 2022-11-16 NOTE — Telephone Encounter (Signed)
RX sent to the required pharmacy.

## 2022-11-16 NOTE — Telephone Encounter (Signed)
Patient states she has not used a [facility] in years and requests [facility] be taken off Preferred Pharmacy List and requests the following RX be sent to the Pharmacy listed below:  (Removed [facility] from List)  MEDICATION: levothyroxine (SYNTHROID) 150 MCG tablet    Is the patient out of medication? Yes-since 11/13/22   PHARMACY:   [id number] [place], [place] Phone: [phone]  Fax: [phone]

## 2022-11-19 ENCOUNTER — Ambulatory Visit: Admitting: Family Medicine

## 2022-12-18 ENCOUNTER — Ambulatory Visit (INDEPENDENT_AMBULATORY_CARE_PROVIDER_SITE_OTHER): Payer: Federal, State, Local not specified - PPO | Admitting: Family Medicine

## 2022-12-18 ENCOUNTER — Encounter: Payer: Self-pay | Admitting: Family Medicine

## 2022-12-18 VITALS — BP 114/78 | HR 94 | Temp 97.8°F | Ht 63.0 in | Wt 164.8 lb

## 2022-12-18 DIAGNOSIS — R7301 Impaired fasting glucose: Secondary | ICD-10-CM | POA: Diagnosis not present

## 2022-12-18 DIAGNOSIS — J4520 Mild intermittent asthma, uncomplicated: Secondary | ICD-10-CM

## 2022-12-18 DIAGNOSIS — E890 Postprocedural hypothyroidism: Secondary | ICD-10-CM | POA: Diagnosis not present

## 2022-12-18 DIAGNOSIS — Z0000 Encounter for general adult medical examination without abnormal findings: Secondary | ICD-10-CM | POA: Diagnosis not present

## 2022-12-18 DIAGNOSIS — Z23 Encounter for immunization: Secondary | ICD-10-CM

## 2022-12-18 NOTE — Patient Instructions (Signed)
Please return in 12 months for your annual complete physical; please come fasting.  Please schedule a lab visit with [place] at your convenience.   I will release your lab results to you on your MyChart account with further instructions. You may see the results before I do, but when I review them I will send you a message with my report or have my assistant call you if things need to be discussed. Please reply to my message with any questions. Thank you!   If you have any questions or concerns, please don't hesitate to send me a message via MyChart or call the office at [phone]. Thank you for visiting with [place] today! It's our pleasure caring for you.   Fat and Cholesterol Restricted Eating Plan Getting too much fat and cholesterol in your diet may cause health problems. Choosing the right foods helps keep your fat and cholesterol at normal levels. This can keep you from getting certain diseases. Your doctor may recommend an eating plan that includes: Total fat: ______% or less of total calories a day. This is ______g of fat a day. Saturated fat: ______% or less of total calories a day. This is ______g of saturated fat a day. Cholesterol: less than _________mg a day. Fiber: ______g a day. What are tips for following this plan? General tips Work with your doctor to lose weight if you need to. Avoid: Foods with added sugar. Fried foods. Foods with trans fat or partially hydrogenated oils. This includes some margarines and baked goods. If you drink alcohol: Limit how much you have to: 0-1 drink a day for women who are not pregnant. 0-2 drinks a day for men. Know how much alcohol is in a drink. In the [place] one drink equals one 12 oz bottle of beer (355 mL), one 5 oz glass of wine (148 mL), or one 1 oz glass of hard liquor (44 mL). Reading food labels Check food labels for: Trans fats. Partially hydrogenated oils. Saturated fat (g) in each serving. Cholesterol (mg) in each serving. Fiber  (g) in each serving. Choose foods with healthy fats, such as: Monounsaturated fats and polyunsaturated fats. These include olive and canola oil, flaxseeds, walnuts, almonds, and seeds. Omega-3 fats. These are found in certain fish, flaxseed oil, and ground flaxseeds. Choose grain products that have whole grains. Look for the word "whole" as the first word in the ingredient list. Cooking Cook foods using low-fat methods. These include baking, boiling, grilling, and broiling. Eat more home-cooked foods. Eat at restaurants and buffets less often. Eat less fast food. Avoid cooking using saturated fats, such as butter, cream, palm oil, palm kernel oil, and coconut oil. Meal planning  At meals, divide your plate into four equal parts: Fill one-half of your plate with vegetables, green salads, and fruit. Fill one-fourth of your plate with whole grains. Fill one-fourth of your plate with low-fat (lean) protein foods. Eat fish that is high in omega-3 fats at least two times a week. This includes mackerel, tuna, sardines, and salmon. Eat foods that are high in fiber, such as whole grains, beans, apples, pears, berries, broccoli, carrots, peas, and barley. What foods should I eat? Fruits All fresh, canned (in natural juice), or frozen fruits. Vegetables Fresh or frozen vegetables (raw, steamed, roasted, or grilled). Green salads. Grains Whole grains, such as whole wheat or whole grain breads, crackers, cereals, and pasta. Unsweetened oatmeal, bulgur, barley, quinoa, or brown rice. Corn or whole wheat flour tortillas. Meats and other protein foods  Ground beef (85% or leaner), grass-fed beef, or beef trimmed of fat. Skinless chicken or [place]. Ground chicken or [place]. Pork trimmed of fat. All fish and seafood. Egg whites. Dried beans, peas, or lentils. Unsalted nuts or seeds. Unsalted canned beans. Nut butters without added sugar or oil. Dairy Low-fat or nonfat dairy products, such as skim or 1% milk,  2% or reduced-fat cheeses, low-fat and fat-free ricotta or cottage cheese, or plain low-fat and nonfat yogurt. Fats and oils Tub margarine without trans fats. Light or reduced-fat mayonnaise and salad dressings. Avocado. Olive, canola, sesame, or safflower oils. The items listed above may not be a complete list of foods and beverages you can eat. Contact a dietitian for more information. What foods should I avoid? Fruits Canned fruit in heavy syrup. Fruit in cream or butter sauce. Fried fruit. Vegetables Vegetables cooked in cheese, cream, or butter sauce. Fried vegetables. Grains White bread. White pasta. White rice. Cornbread. Bagels, pastries, and croissants. Crackers and snack foods that contain trans fat and hydrogenated oils. Meats and other protein foods Fatty cuts of meat. Ribs, chicken wings, bacon, sausage, bologna, salami, chitterlings, fatback, hot dogs, bratwurst, and packaged lunch meats. Liver and organ meats. Whole eggs and egg yolks. Chicken and [place] with skin. Fried meat. Dairy Whole or 2% milk, cream, half-and-half, and cream cheese. Whole milk cheeses. Whole-fat or sweetened yogurt. Full-fat cheeses. Nondairy creamers and whipped toppings. Processed cheese, cheese spreads, and cheese curds. Fats and oils Butter, stick margarine, lard, shortening, ghee, or bacon fat. Coconut, palm kernel, and palm oils. Beverages Alcohol. Sugar-sweetened drinks such as sodas, lemonade, and fruit drinks. Sweets and desserts Corn syrup, sugars, honey, and molasses. Candy. Jam and jelly. Syrup. Sweetened cereals. Cookies, pies, cakes, donuts, muffins, and ice cream. The items listed above may not be a complete list of foods and beverages you should avoid. Contact a dietitian for more information. Summary Choosing the right foods helps keep your fat and cholesterol at normal levels. This can keep you from getting certain diseases. At meals, fill one-half of your plate with vegetables, green  salads, and fruits. Eat high fiber foods, like whole grains, beans, apples, pears, berries, carrots, peas, and barley. Limit added sugar, saturated fats, alcohol, and fried foods. This information is not intended to replace advice given to you by your health care provider. Make sure you discuss any questions you have with your health care provider. Document Revised: 02/24/2021 Document Reviewed: 02/24/2021 Elsevier Patient Education  [place].

## 2022-12-18 NOTE — Progress Notes (Signed)
Subjective  Chief Complaint  Patient presents with   Annual Exam    Pt here for Annual exam and is not currently fasting     HPI: Melanie Travis is a 52 y.o. female who presents to [facility] today for a Female Wellness Visit. She also has the concerns and/or needs as listed above in the chief complaint. These will be addressed in addition to the Health Maintenance Visit.   Wellness Visit: annual visit with health maintenance review and exam without Pap  HM: screens are current. Imms are current. Flu shot today Chronic disease f/u and/or acute problem visit: (deemed necessary to be done in addition to the wellness visit): Low thyroid: feels like she could be high again: brittle nails and brain fog and hungrier than normal. This is how she felt in the past.  Weight gain; eats candy, out a lot, works 6days a week and limited exercise IFG: admits to nocturia. No asthma complications. Rare albuterol use  Assessment  1. Annual physical exam   2. Postablative hypothyroidism   3. Mild intermittent asthma without complication   4. IFG (impaired fasting glucose)   5. Need for immunization against influenza      Plan  Female Wellness Visit: Age appropriate Health Maintenance and Prevention measures were discussed with patient. Included topics are cancer screening recommendations, ways to keep healthy (see AVS) including dietary and exercise recommendations, regular eye and dental care, use of seat belts, and avoidance of moderate alcohol use and tobacco use.  BMI: discussed patient's BMI and encouraged positive lifestyle modifications to help get to or maintain a target BMI. HM needs and immunizations were addressed and ordered. See below for orders. See HM and immunization section for updates. Routine labs and screening tests ordered including cmp, cbc and lipids where appropriate. Discussed recommendations regarding Vit D and calcium supplementation (see  AVS)  Chronic disease management visit and/or acute problem visit: Recheck thyroid levels. Adjust meds if needed. Currently on levothx 125mg daily Discussed high cholesterol levels and better diet for weight loss. Check A1c MIA; controlled.   Follow up: 12 mo for cpe  Orders Placed This Encounter  Procedures   Flu Vaccine QUAD 627moM (Fluarix, Fluzone & Alfiuria Quad PF)   CBC with Differential/Platelet   Comprehensive metabolic panel   Hemoglobin A1c   Lipid panel   TSH   No orders of the defined types were placed in this encounter.     Body mass index is 29.19 kg/m. 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   Postablative hypothyroidism 12/13/2021   Mild intermittent asthma 12/12/2020   Multiple food allergies 12/12/2020   Menopausal hot flushes 12/12/2020   History of Graves' disease 11/05/2018   H/O radioactive iodine thyroid ablation 11/05/2018   DDD (degenerative disc disease), lumbosacral 08/18/2012   Health Maintenance  Topic Date Due   COVID-19 Vaccine (1) 01/03/2023 (Originally 12/13/1975)   MAMMOGRAM  05/30/2023   Fecal DNA (Cologuard)  02/18/2024   DTaP/Tdap/Td (3 - Td or Tdap) 12/14/2031   INFLUENZA VACCINE  Completed   Hepatitis C Screening  Completed   HIV Screening  Completed   Zoster Vaccines- Shingrix  Completed  HPV VACCINES  Aged Out   Immunization History  Administered Date(s) Administered   Influenza,inj,Quad PF,6+ Mos 09/15/2018, 08/11/2019, 08/15/2020, 08/21/2021, 12/18/2022   Tdap 11/15/2000, 12/13/2021   Zoster Recombinat (Shingrix) 12/13/2021, 02/21/2022   We updated and reviewed the patient's past history in detail and it is documented below. Allergies: Patient is allergic to  prednisone, dexamethasone, tramadol, elavil  [amitriptyline hcl], propylene glycol, and soy allergy. Past Medical History Patient  has a past medical history of Allergy, Arthritis, Complex sclerosing lesion of breast 2022 (12/13/2021), Graves disease, Postablative hypothyroidism (12/13/2021), and Thyroid disease. Past Surgical History Patient  has a past surgical history that includes hystrectomy; Shoulder surgery; Breast biopsy (Right, 06/02/2021); and Breast biopsy (Right, 06/21/2021). Family History: Patient family history includes Allergies in her father; Celiac disease in her mother. Social History:  Patient  reports that she quit smoking about 4 years ago. Her smoking use included cigarettes. She has never used smokeless tobacco. She reports current alcohol use of about 2.0 - 4.0 standard drinks of alcohol per week. She reports that she does not use drugs.  Review of Systems: Constitutional: negative for fever or malaise Ophthalmic: negative for photophobia, double vision or loss of vision Cardiovascular: negative for chest pain, dyspnea on exertion, or new LE swelling Respiratory: negative for SOB or persistent cough Gastrointestinal: negative for abdominal pain, change in bowel habits or melena Genitourinary: negative for dysuria or gross hematuria, no abnormal uterine bleeding or disharge Musculoskeletal: negative for new gait disturbance or muscular weakness Integumentary: negative for new or persistent rashes, no breast lumps Neurological: negative for TIA or stroke symptoms Psychiatric: negative for SI or delusions Allergic/Immunologic: negative for hives  Patient Care Team    Relationship Specialty Notifications Start End  Leamon Arnt, MD PCP - General Family Medicine  12/12/20   Shamleffer, Melanie Crazier, MD Consulting Physician Endocrinology  12/13/21   Rolm Bookbinder, MD Consulting Physician General Surgery  12/13/21     Objective  Vitals: BP 114/78   Pulse 94    Temp 97.8 F (36.6 C)   Ht 5' 3"$  (1.6 m)   Wt 164 lb 12.8 oz (74.8 kg)   SpO2 96%   BMI 29.19 kg/m  General:  Well developed, well nourished, no acute distress  Psych:  Alert and orientedx3,normal mood and affect HEENT:  Normocephalic, atraumatic, non-icteric sclera,  supple neck without adenopathy, mass or thyromegaly Cardiovascular:  Normal S1, S2, RRR without gallop, rub or murmur Respiratory:  Good breath sounds bilaterally, CTAB with normal respiratory effort Gastrointestinal: normal bowel sounds, soft, non-tender, no noted masses. No HSM MSK: no deformities, contusions. Joints are without erythema or swelling.  Skin:  Warm, no rashes or suspicious lesions noted Neurologic:    Mental status is normal. CN 2-11 are normal. Gross motor and sensory exams are normal. Normal gait. No tremor   Commons side effects, risks, benefits, and alternatives for medications and treatment plan prescribed today were discussed, and the patient expressed understanding of the given instructions. Patient is instructed to call or message via MyChart if he/she has any questions or concerns regarding our treatment plan. No barriers to understanding were identified. We discussed Red Flag symptoms and signs in detail. Patient expressed understanding regarding what to do in case of urgent or emergency type symptoms.  Medication list was reconciled, printed and provided to the patient in AVS. Patient instructions and summary information was reviewed with the patient as documented in the AVS. This note was prepared with assistance of Dragon voice  recognition software. Occasional wrong-word or sound-a-like substitutions may have occurred due to the inherent limitations of voice recognition software

## 2022-12-21 ENCOUNTER — Other Ambulatory Visit (INDEPENDENT_AMBULATORY_CARE_PROVIDER_SITE_OTHER): Payer: Federal, State, Local not specified - PPO

## 2022-12-21 DIAGNOSIS — E890 Postprocedural hypothyroidism: Secondary | ICD-10-CM | POA: Diagnosis not present

## 2022-12-21 DIAGNOSIS — R7301 Impaired fasting glucose: Secondary | ICD-10-CM | POA: Diagnosis not present

## 2022-12-21 DIAGNOSIS — Z0000 Encounter for general adult medical examination without abnormal findings: Secondary | ICD-10-CM | POA: Diagnosis not present

## 2022-12-21 LAB — CBC WITH DIFFERENTIAL/PLATELET
Basophils Absolute: 0 10*3/uL (ref 0.0–0.1)
Basophils Relative: 0.7 % (ref 0.0–3.0)
Eosinophils Absolute: 0.1 10*3/uL (ref 0.0–0.7)
Eosinophils Relative: 2.5 % (ref 0.0–5.0)
HCT: 41.2 % (ref 36.0–46.0)
Hemoglobin: 14.1 g/dL (ref 12.0–15.0)
Lymphocytes Relative: 27.8 % (ref 12.0–46.0)
Lymphs Abs: 1.5 10*3/uL (ref 0.7–4.0)
MCHC: 34.2 g/dL (ref 30.0–36.0)
MCV: 85.8 fl (ref 78.0–100.0)
Monocytes Absolute: 0.4 10*3/uL (ref 0.1–1.0)
Monocytes Relative: 7.6 % (ref 3.0–12.0)
Neutro Abs: 3.3 10*3/uL (ref 1.4–7.7)
Neutrophils Relative %: 61.4 % (ref 43.0–77.0)
Platelets: 306 10*3/uL (ref 150.0–400.0)
RBC: 4.81 Mil/uL (ref 3.87–5.11)
RDW: 13.3 % (ref 11.5–15.5)
WBC: 5.4 10*3/uL (ref 4.0–10.5)

## 2022-12-21 LAB — LIPID PANEL
Cholesterol: 253 mg/dL — ABNORMAL HIGH (ref 0–200)
HDL: 49.6 mg/dL (ref >39.00)
NonHDL: 203.06
Total CHOL/HDL Ratio: 5
Triglycerides: 231 mg/dL — ABNORMAL HIGH (ref 0.0–149.0)
VLDL: 46.2 mg/dL — ABNORMAL HIGH (ref 0.0–40.0)

## 2022-12-21 LAB — COMPREHENSIVE METABOLIC PANEL
ALT: 36 U/L — ABNORMAL HIGH (ref 0–35)
AST: 26 U/L (ref 0–37)
Albumin: 4.7 g/dL (ref 3.5–5.2)
Alkaline Phosphatase: 93 U/L (ref 39–117)
BUN: 16 mg/dL (ref 6–23)
CO2: 28 mEq/L (ref 19–32)
Calcium: 10.4 mg/dL (ref 8.4–10.5)
Chloride: 101 mEq/L (ref 96–112)
Creatinine, Ser: 0.91 mg/dL (ref 0.40–1.20)
GFR: 72.27 mL/min (ref >60.00)
Glucose, Bld: 95 mg/dL (ref 70–99)
Potassium: 4.4 mEq/L (ref 3.5–5.1)
Sodium: 140 mEq/L (ref 135–145)
Total Bilirubin: 0.4 mg/dL (ref 0.2–1.2)
Total Protein: 7 g/dL (ref 6.0–8.3)

## 2022-12-21 LAB — HEMOGLOBIN A1C: Hgb A1c MFr Bld: 5.6 % (ref 4.6–6.5)

## 2022-12-21 LAB — TSH: TSH: 1.75 u[IU]/mL (ref 0.35–5.50)

## 2022-12-21 LAB — LDL CHOLESTEROL, DIRECT: Direct LDL: 151 mg/dL

## 2023-04-11 DIAGNOSIS — M5416 Radiculopathy, lumbar region: Secondary | ICD-10-CM | POA: Diagnosis not present

## 2023-04-11 DIAGNOSIS — M5412 Radiculopathy, cervical region: Secondary | ICD-10-CM | POA: Diagnosis not present

## 2023-04-11 DIAGNOSIS — M9904 Segmental and somatic dysfunction of sacral region: Secondary | ICD-10-CM | POA: Diagnosis not present

## 2023-04-11 DIAGNOSIS — M9901 Segmental and somatic dysfunction of cervical region: Secondary | ICD-10-CM | POA: Diagnosis not present

## 2023-07-10 DIAGNOSIS — M7732 Calcaneal spur, left foot: Secondary | ICD-10-CM | POA: Diagnosis not present

## 2023-07-10 DIAGNOSIS — M792 Neuralgia and neuritis, unspecified: Secondary | ICD-10-CM | POA: Diagnosis not present

## 2023-08-07 DIAGNOSIS — M792 Neuralgia and neuritis, unspecified: Secondary | ICD-10-CM | POA: Diagnosis not present

## 2023-11-05 ENCOUNTER — Other Ambulatory Visit: Payer: Self-pay

## 2023-11-05 ENCOUNTER — Telehealth: Payer: Self-pay | Admitting: Family Medicine

## 2023-11-05 DIAGNOSIS — E0500 Thyrotoxicosis with diffuse goiter without thyrotoxic crisis or storm: Secondary | ICD-10-CM

## 2023-11-05 MED ORDER — LEVOTHYROXINE SODIUM 150 MCG PO TABS: ORAL_TABLET | ORAL | 3 refills | Status: DC

## 2023-11-05 NOTE — Telephone Encounter (Signed)
 Rx sent

## 2023-11-05 NOTE — Telephone Encounter (Signed)
 Prescription Request  11/05/2023  LOV: 12/18/2022  What is the name of the medication or equipment? levothyroxine  (SYNTHROID ) 150 MCG tablet   Have you contacted your pharmacy to request a refill? Yes   Which pharmacy would you like this sent to?  [id number] - [place], [place] - [address] AT [facility] OF [address] [place] [address] Phone: [phone] Fax: [phone]    Patient notified that their request is being sent to the clinical staff for review and that they should receive a response within 2 business days.   Please advise at Mobile [phone] (mobile)

## 2023-12-25 ENCOUNTER — Encounter: Payer: Self-pay | Admitting: Family Medicine

## 2023-12-25 ENCOUNTER — Ambulatory Visit (INDEPENDENT_AMBULATORY_CARE_PROVIDER_SITE_OTHER): Payer: Federal, State, Local not specified - PPO | Admitting: Family Medicine

## 2023-12-25 VITALS — BP 131/87 | HR 90 | Temp 98.1°F | Ht 63.0 in | Wt 169.2 lb

## 2023-12-25 DIAGNOSIS — Z8639 Personal history of other endocrine, nutritional and metabolic disease: Secondary | ICD-10-CM

## 2023-12-25 DIAGNOSIS — Z13220 Encounter for screening for lipoid disorders: Secondary | ICD-10-CM

## 2023-12-25 DIAGNOSIS — R7301 Impaired fasting glucose: Secondary | ICD-10-CM

## 2023-12-25 DIAGNOSIS — G2581 Restless legs syndrome: Secondary | ICD-10-CM

## 2023-12-25 DIAGNOSIS — E890 Postprocedural hypothyroidism: Secondary | ICD-10-CM

## 2023-12-25 DIAGNOSIS — Z1231 Encounter for screening mammogram for malignant neoplasm of breast: Secondary | ICD-10-CM

## 2023-12-25 DIAGNOSIS — J4520 Mild intermittent asthma, uncomplicated: Secondary | ICD-10-CM

## 2023-12-25 DIAGNOSIS — Z1211 Encounter for screening for malignant neoplasm of colon: Secondary | ICD-10-CM

## 2023-12-25 DIAGNOSIS — Z0001 Encounter for general adult medical examination with abnormal findings: Secondary | ICD-10-CM | POA: Diagnosis not present

## 2023-12-25 LAB — CBC WITH DIFFERENTIAL/PLATELET
Basophils Absolute: 0 10*3/uL (ref 0.0–0.1)
Basophils Relative: 0.5 % (ref 0.0–3.0)
Eosinophils Absolute: 0.1 10*3/uL (ref 0.0–0.7)
Eosinophils Relative: 2.1 % (ref 0.0–5.0)
HCT: 43.1 % (ref 36.0–46.0)
Hemoglobin: 14.5 g/dL (ref 12.0–15.0)
Lymphocytes Relative: 26.4 % (ref 12.0–46.0)
Lymphs Abs: 1.6 10*3/uL (ref 0.7–4.0)
MCHC: 33.7 g/dL (ref 30.0–36.0)
MCV: 86.4 fl (ref 78.0–100.0)
Monocytes Absolute: 0.5 10*3/uL (ref 0.1–1.0)
Monocytes Relative: 7.7 % (ref 3.0–12.0)
Neutro Abs: 3.8 10*3/uL (ref 1.4–7.7)
Neutrophils Relative %: 63.3 % (ref 43.0–77.0)
Platelets: 309 10*3/uL (ref 150.0–400.0)
RBC: 5 Mil/uL (ref 3.87–5.11)
RDW: 12.7 % (ref 11.5–15.5)
WBC: 6 10*3/uL (ref 4.0–10.5)

## 2023-12-25 LAB — COMPREHENSIVE METABOLIC PANEL
ALT: 33 U/L (ref 0–35)
AST: 27 U/L (ref 0–37)
Albumin: 4.7 g/dL (ref 3.5–5.2)
Alkaline Phosphatase: 85 U/L (ref 39–117)
BUN: 19 mg/dL (ref 6–23)
CO2: 27 meq/L (ref 19–32)
Calcium: 9.9 mg/dL (ref 8.4–10.5)
Chloride: 102 meq/L (ref 96–112)
Creatinine, Ser: 0.8 mg/dL (ref 0.40–1.20)
GFR: 83.76 mL/min (ref >60.00)
Glucose, Bld: 98 mg/dL (ref 70–99)
Potassium: 4.3 meq/L (ref 3.5–5.1)
Sodium: 140 meq/L (ref 135–145)
Total Bilirubin: 0.3 mg/dL (ref 0.2–1.2)
Total Protein: 7.7 g/dL (ref 6.0–8.3)

## 2023-12-25 LAB — LIPID PANEL
Cholesterol: 264 mg/dL — ABNORMAL HIGH (ref 0–200)
HDL: 47.1 mg/dL (ref >39.00)
LDL Cholesterol: 148 mg/dL — ABNORMAL HIGH (ref 0–99)
NonHDL: 217.05
Total CHOL/HDL Ratio: 6
Triglycerides: 344 mg/dL — ABNORMAL HIGH (ref 0.0–149.0)
VLDL: 68.8 mg/dL — ABNORMAL HIGH (ref 0.0–40.0)

## 2023-12-25 LAB — HEMOGLOBIN A1C: Hgb A1c MFr Bld: 5.8 % (ref 4.6–6.5)

## 2023-12-25 LAB — TSH: TSH: 2.87 u[IU]/mL (ref 0.35–5.50)

## 2023-12-25 MED ORDER — ROPINIROLE HCL 0.25 MG PO TABS: ORAL_TABLET | ORAL | 0 refills | Status: DC

## 2023-12-25 NOTE — Patient Instructions (Addendum)
 Please return in 12 months for your annual complete physical; please come fasting. Sooner if need more help with restless leg syndrome (see info below) or stress management.   Please consider counseling to get help managing your stress.  I have ordered a medication to start for your restless legs. Take for a month as directed. Can let me know how it is doing and I will refill with the proper dose after 4 weeks. Schedule another office visit if not improving.   I will release your lab results to you on your MyChart account with further instructions. You may see the results before I do, but when I review them I will send you a message with my report or have my assistant call you if things need to be discussed. Please reply to my message with any questions. Thank you!   If you have any questions or concerns, please don't hesitate to send me a message via MyChart or call the office at [phone]. Thank you for visiting with [place] today! It's our pleasure caring for you.   Please call the office checked below to schedule your appointment for your mammogram and/or bone density screen (the checked studies were ordered): [x]   Mammogram  []   Bone Density  [x]   [facility]     [address] [place], [place]        [phone]         []   [facility]  [address] [place], [place]  [phone]   I recommend the Cologuard test for your colon cancer screening that is due. I have ordered this test for you. The Cologuard company will soon contact you to verify your insurance, address etc. They will then send you the kit; follow the instructions in the kit and return the kit to [employer]. They will run the test and send the results to me. I will then give you the results. If this test is negative, we recommend repeating a colon cancer screening test in 3 years. If it is positive, I will refer you to a Gastroenterologist so you can get set up for the recommended  colonoscopy.  Thank you!    Restless Legs Syndrome Restless legs syndrome is a condition that causes uncomfortable feelings or sensations in the legs, especially while sitting or lying down. The sensations usually cause an overwhelming urge to move the legs. The arms can also sometimes be affected. The condition can range from mild to severe. The symptoms often interfere with a person's ability to sleep. What are the causes? The cause of this condition is not known. What increases the risk? The following factors may make you more likely to develop this condition: Being older than 50. Pregnancy. Being a woman. In general, the condition is more common in women than in men. A family history of the condition. Having iron deficiency. Overuse of caffeine, nicotine, or alcohol. Certain medical conditions, such as kidney disease, Parkinson's disease, or nerve damage. Certain medicines, such as those for high blood pressure, nausea, colds, allergies, depression, and some heart conditions. What are the signs or symptoms? The main symptom of this condition is uncomfortable sensations in the legs, such as: Pulling. Tingling. Prickling. Throbbing. Crawling. Burning. Usually, the sensations: Affect both sides of the body. Are worse when you sit or lie down. Are worse at night. These may make it difficult to fall asleep. Make you have a strong urge to move your  legs. Are temporarily relieved by moving your legs or standing. The arms can also be affected, but this is rare. People who have this condition often have tiredness during the day because of their lack of sleep at night. How is this diagnosed? This condition may be diagnosed based on: Your symptoms. Blood tests. In some cases, you may be monitored in a sleep lab by a specialist (a sleep study). This can detect any disruptions in your sleep. How is this treated? This condition is treated by managing the symptoms. This may  include: Lifestyle changes, such as exercising, using relaxation techniques, and avoiding caffeine, alcohol, or tobacco. Iron supplements. Medicines. Parkinson's medications may be tried first. Anti-seizure medications can also be helpful. Follow these instructions at home: General instructions Take over-the-counter and prescription medicines only as told by your health care provider. Use methods to help relieve the uncomfortable sensations, such as: Massaging your legs. Walking or stretching. Taking a cold or hot bath. Keep all follow-up visits. This is important. Lifestyle     Practice good sleep habits. For example, go to bed and get up at the same time every day. Most adults should get 7-9 hours of sleep each night. Exercise regularly. Try to get at least 30 minutes of exercise most days of the week. Practice ways of relaxing, such as yoga or meditation. Avoid caffeine and alcohol. Do not use any products that contain nicotine or tobacco. These products include cigarettes, chewing tobacco, and vaping devices, such as e-cigarettes. If you need help quitting, ask your health care provider. Where to find more information [employer] of Neurological Disorders and Stroke: [employer] Contact a health care provider if: Your symptoms get worse or they do not improve with treatment. Summary Restless legs syndrome is a condition that causes uncomfortable feelings or sensations in the legs, especially while sitting or lying down. The symptoms often interfere with your ability to sleep. This condition is treated by managing the symptoms. You may need to make lifestyle changes or take medicines. This information is not intended to replace advice given to you by your health care provider. Make sure you discuss any questions you have with your health care provider. Document Revised: 05/28/2021 Document Reviewed: 05/28/2021 Elsevier Patient Education  2024 [employer].

## 2023-12-25 NOTE — Progress Notes (Signed)
 Subjective  Chief Complaint  Patient presents with   Annual Exam    Pt here for Annual Exam and is not currently fasting    I have reviewed the [facility] Code of Professional Conduct under Section III, External Relations, Item C. Outside Activities Policy.  HPI: Melanie Travis is a 53 y.o. female who presents to [facility] [place] today for a Female Wellness Visit. She also has the concerns and/or needs as listed above in the chief complaint. These will be addressed in addition to the Health Maintenance Visit.   Wellness Visit: annual visit with health maintenance review and exam  HM: overdue for mammogram. CRC screen due in April had neg cologuard in 2022. Eligible for flu and prevnar 20 but pt declines today in spite of education. Workign 14 hour days. Life has been hard this year. See below.  Chronic disease f/u and/or acute problem visit: (deemed necessary to be done in addition to the wellness visit): History of Present Illness The patient presents with stress and anxiety related to family issues.  The patient experiences significant stress and anxiety primarily due to family issues involving their son, who has a history of traumatic brain injury (TBI) from a motorcycle accident five to six years ago. Their son has had recent struggles, including a period of homelessness, weight loss, and a change in personality, leading to conflicts with family members. The patient describes a recent incident where their son was wandering the streets homeless for a few weeks, resulting in significant stress and anxiety. Their son has since returned home but continues to have issues with memory and behavior, affecting the entire family.  The patient mentions that their son's TBI has impacted his cognitive abilities, making him forget conversations and events, which adds to the family's stress. Their son has been living with his twin brother intermittently since returning home. The patient  also discusses their husband's health issues, including diabetes and memory loss, which contribute to the overall stress of the situation.  The patient reports experiencing leg discomfort, describing it as crampy and uncomfortable, which sometimes interrupts their sleep. They have tried supplements like magnesium, potassium, and calcium, which provide some relief. They have not tried any medications for this issue yet.  The patient has a history of asthma and has declined flu and pneumonia vaccinations due to previous adverse reactions. They are due for a colon cancer screening with a Cologuard test, which is overdue by three years.  Assessment  1. Encounter for well adult exam with abnormal findings   2. Postablative hypothyroidism   3. History of Graves' disease   4. IFG (impaired fasting glucose)   5. Mild intermittent asthma without complication   6. Screening for colorectal cancer   7. Screening mammogram for breast cancer   8. RLS (restless legs syndrome)      Plan  Female Wellness Visit: Age appropriate Health Maintenance and Prevention measures were discussed with patient. Included topics are cancer screening recommendations, ways to keep healthy (see AVS) including dietary and exercise recommendations, regular eye and dental care, use of seat belts, and avoidance of moderate alcohol use and tobacco use. Pt to schedule mammogram. Cologuard ordered for April screening.  BMI: discussed patient's BMI and encouraged positive lifestyle modifications to help get to or maintain a target BMI. HM needs and immunizations were addressed and ordered. See below for orders. See HM and immunization section for updates. Pt declines vaccines at this time Routine labs and screening  tests ordered including cmp, cbc and lipids where appropriate. Discussed recommendations regarding Vit D and calcium supplementation (see AVS)  Chronic disease management visit and/or acute problem visit: Assessment and  Plan Depression: Positive depression screen (score of 9) with increased stress due to son's health issues and work demands. Patient reports feeling "okay" but has difficulty managing emotional responses and maintaining personal relationships. -Consider counseling to help manage stress and emotional responses. -Continue to monitor mood, anxiety, stress levels, and sleep patterns.  Check A1c to monitor prediabetes. She denies sxs of diabetes  Asthma is well controlled  Hypothryoidism is clinically controlled. Recheck TSH on meds.   RLS- new dx: will start treatment with ropinerole. Titrate up dose and monitor response. See AVS for education. Check iron levels and tsh  General Health Maintenance: -Continue regular check-ups and screenings as appropriate for age and health status. Follow up: 12 mo for cpe, sooner if needs help with RLS or mood   Orders Placed This Encounter  Procedures   MM DIGITAL SCREENING BILATERAL   CBC with Differential/Platelet   Comprehensive metabolic panel   Lipid panel   Hemoglobin A1c   TSH   Cologuard   Iron, TIBC and Ferritin Panel   Meds ordered this encounter  Medications   rOPINIRole (REQUIP) 0.25 MG tablet    Sig: Take 1 tablet (0.25 mg total) by mouth at bedtime for 2 days, THEN 2 tablets (0.5 mg total) at bedtime for 5 days, THEN 4 tablets (1 mg total) at bedtime for 21 days.    Dispense:  96 tablet    Refill:  0      Body mass index is 29.97 kg/m. 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    Postablative hypothyroidism 12/13/2021    Mild intermittent asthma 12/12/2020    Multiple food allergies 12/12/2020    Menopausal hot flushes 12/12/2020    History of Graves'  disease 11/05/2018    H/O radioactive iodine thyroid ablation 11/05/2018    DDD (degenerative disc disease), lumbosacral 08/18/2012    Health Maintenance  Topic Date Due   MAMMOGRAM  05/30/2023   COVID-19 Vaccine (1) 01/10/2024 (Originally 12/13/1975)   INFLUENZA VACCINE  01/27/2024 (Originally 05/30/2023)   Pneumococcal Vaccine 88-54 Years old (1 of 2 - PCV) 12/24/2024 (Originally 12/14/1976)   Fecal DNA (Cologuard)  02/18/2024   DTaP/Tdap/Td (3 - Td or Tdap) 12/14/2031   Hepatitis C Screening  Completed   HIV Screening  Completed   Zoster Vaccines- Shingrix  Completed   HPV VACCINES  Aged Out   Immunization History  Administered Date(s) Administered   Influenza,inj,Quad PF,6+ Mos 09/15/2018, 08/11/2019, 08/15/2020, 08/21/2021, 12/18/2022   Tdap 11/15/2000, 12/13/2021   Zoster Recombinant(Shingrix) 12/13/2021, 02/21/2022   We updated and reviewed the patient's past history in detail and it is documented below. Allergies: Patient is allergic to prednisone, dexamethasone, tramadol, elavil  [amitriptyline hcl], propylene glycol, and soy allergy (obsolete). Past Medical History Patient  has a past medical history of Allergy, Arthritis, Complex sclerosing lesion of breast 2022 (12/13/2021), Graves disease, Postablative hypothyroidism (12/13/2021), and Thyroid disease. Past Surgical History Patient  has a past surgical history that includes hystrectomy; Shoulder surgery; Breast biopsy (Right,  06/02/2021); and Breast biopsy (Right, 06/21/2021). Family History: Patient family history includes Allergies in her father; Celiac disease in her mother. Social History:  Patient  reports that she quit smoking about 5 years ago. Her smoking use included cigarettes. She has never used smokeless tobacco. She reports current alcohol use of about 2.0 - 4.0 standard drinks of alcohol per week. She reports that she does not use drugs.  Review of Systems: Constitutional: negative for fever or  malaise Ophthalmic: negative for photophobia, double vision or loss of vision Cardiovascular: negative for chest pain, dyspnea on exertion, or new LE swelling Respiratory: negative for SOB or persistent cough Gastrointestinal: negative for abdominal pain, change in bowel habits or melena Genitourinary: negative for dysuria or gross hematuria, no abnormal uterine bleeding or disharge Musculoskeletal: negative for new gait disturbance or muscular weakness Integumentary: negative for new or persistent rashes, no breast lumps Neurological: negative for TIA or stroke symptoms Psychiatric: negative for SI or delusions Allergic/Immunologic: negative for hives  Patient Care Team    Relationship Specialty Notifications Start End  Willow Ora, MD PCP - General Family Medicine  12/12/20   Shamleffer, Konrad Dolores, MD Consulting Physician Endocrinology  12/13/21   Emelia Loron, MD Consulting Physician General Surgery  12/13/21     Objective  Vitals: BP 131/87   Pulse 90   Temp 98.1 F (36.7 C)   Ht 5\' 3"  (1.6 m)   Wt 169 lb 3.2 oz (76.7 kg)   SpO2 99%   BMI 29.97 kg/m  General:  Well developed, well nourished, no acute distress  Psych:  Alert and orientedx3,normal mood and affect, tearful, appropriate and good insight HEENT:  Normocephalic, atraumatic, non-icteric sclera,  supple neck without adenopathy, mass or thyromegaly Cardiovascular:  Normal S1, S2, RRR without gallop, rub or murmur Respiratory:  Good breath sounds bilaterally, CTAB with normal respiratory effort Gastrointestinal: normal bowel sounds, soft, non-tender, no noted masses. No HSM MSK: extremities without edema, joints without erythema or swelling Neurologic:    Mental status is normal.  Gross motor and sensory exams are normal.  No tremor  Commons side effects, risks, benefits, and alternatives for medications and treatment plan prescribed today were discussed, and the patient expressed understanding of the  given instructions. Patient is instructed to call or message via MyChart if he/she has any questions or concerns regarding our treatment plan. No barriers to understanding were identified. We discussed Red Flag symptoms and signs in detail. Patient expressed understanding regarding what to do in case of urgent or emergency type symptoms.  Medication list was reconciled, printed and provided to the patient in AVS. Patient instructions and summary information was reviewed with the patient as documented in the AVS. This note was prepared with assistance of Dragon voice recognition software. Occasional wrong-word or sound-a-like substitutions may have occurred due to the inherent limitations of voice recognition software

## 2023-12-26 ENCOUNTER — Encounter: Payer: Self-pay | Admitting: Family Medicine

## 2023-12-26 LAB — IRON,TIBC AND FERRITIN PANEL
%SAT: 17 % (ref 16–45)
Ferritin: 31 ng/mL (ref 16–232)
Iron: 62 ug/dL (ref 45–160)
TIBC: 360 ug/dL (ref 250–450)

## 2023-12-26 NOTE — Progress Notes (Signed)
 Labs reviewed.  The 10-year ASCVD risk score (Arnett DK, et al., 2019) is: 3%, elevated nonfasting trigs   Values used to calculate the score:     Age: 53 years     Sex: Female     Is Non-Hispanic African American: No     Diabetic: No     Tobacco smoker: No     Systolic Blood Pressure: 131 mmHg     Is BP treated: No     HDL Cholesterol: 47.1 mg/dL     Total Cholesterol: 264 mg/dL  Dear Melanie Travis, Thank you for allowing me to care for you at your recent office visit.  I wanted to let you know that I have reviewed your lab test results and am happy to report that they are mostly all good.  Your blood counts, kidney and liver tests are all normal.  Your diabetic sugar test is in range.  Your cholesterol levels are borderline high. Your triglycerides are high in part due to the non fasting state, but the trend is there over the years: I recommend a low fat diet and considering adding fish oil capsules twice a day to lower this number.  Hopefully the new medication will help your restless leg symptoms.  Take care of yourself.  Sincerely, Dr. Mardelle Matte

## 2024-01-01 ENCOUNTER — Other Ambulatory Visit: Payer: Self-pay | Admitting: Family Medicine

## 2024-01-01 DIAGNOSIS — D241 Benign neoplasm of right breast: Secondary | ICD-10-CM

## 2024-02-05 ENCOUNTER — Ambulatory Visit
Admission: RE | Admit: 2024-02-05 | Discharge: 2024-02-05 | Disposition: A | Discharge status: Home / Self Care | Source: Ambulatory Visit | Attending: Family Medicine | Admitting: Family Medicine

## 2024-02-05 ENCOUNTER — Other Ambulatory Visit: Payer: Self-pay | Admitting: Family Medicine

## 2024-02-05 DIAGNOSIS — D241 Benign neoplasm of right breast: Secondary | ICD-10-CM

## 2024-02-05 DIAGNOSIS — N6321 Unspecified lump in the left breast, upper outer quadrant: Secondary | ICD-10-CM | POA: Diagnosis not present

## 2024-02-05 DIAGNOSIS — R928 Other abnormal and inconclusive findings on diagnostic imaging of breast: Secondary | ICD-10-CM | POA: Diagnosis not present

## 2024-02-06 MED ORDER — ROPINIROLE HCL 0.25 MG PO TABS: ORAL_TABLET | ORAL | 0 refills | Status: DC

## 2024-04-06 DIAGNOSIS — M9904 Segmental and somatic dysfunction of sacral region: Secondary | ICD-10-CM | POA: Diagnosis not present

## 2024-04-06 DIAGNOSIS — M5416 Radiculopathy, lumbar region: Secondary | ICD-10-CM | POA: Diagnosis not present

## 2024-07-27 DIAGNOSIS — M9904 Segmental and somatic dysfunction of sacral region: Secondary | ICD-10-CM | POA: Diagnosis not present

## 2024-07-27 DIAGNOSIS — M5416 Radiculopathy, lumbar region: Secondary | ICD-10-CM | POA: Diagnosis not present

## 2024-09-24 LAB — COLOGUARD: COLOGUARD: NEGATIVE

## 2024-09-25 LAB — COLOGUARD: Cologuard: NEGATIVE

## 2024-10-23 ENCOUNTER — Encounter: Payer: Self-pay | Admitting: Family Medicine

## 2024-10-29 DIAGNOSIS — E0500 Thyrotoxicosis with diffuse goiter without thyrotoxic crisis or storm: Secondary | ICD-10-CM

## 2024-10-30 ENCOUNTER — Telehealth: Payer: Self-pay | Admitting: Family Medicine

## 2024-10-30 ENCOUNTER — Ambulatory Visit: Payer: Self-pay

## 2024-10-30 NOTE — Telephone Encounter (Signed)
 Spoke with patient. Pain is under Ribs, gets better with inhaler but concerned about pneumonia. Informed patient that we no not hae anyu openings. Patient agreed to go to Urgent Care.

## 2024-10-30 NOTE — Telephone Encounter (Signed)
 Spoke with patient, see additional encounter notes. Patient agreed to go to UC.

## 2024-10-30 NOTE — Telephone Encounter (Signed)
 Triage nurse, Rocky, called stating pt was declining to go to ED per her final disposition. Please see triage note and advise.

## 2024-10-30 NOTE — Telephone Encounter (Signed)
 FYI Only or Action Required?: Action required by provider: request for appointment and refused ED.  Patient was last seen in primary care on 12/25/2023 by Jodie Lavern CROME, MD.  Called Nurse Triage reporting Chest Pain.  Symptoms began several days ago.  Interventions attempted: OTC medications: Aleve and Prescription medications: inhaler.  Symptoms are: unchanged.  Triage Disposition: Go to ED Now (or PCP Triage)  Patient/caregiver understands and will follow disposition?: No, refuses disposition                                  1. LOCATION: Where does it hurt?       Bilateral lower lungs/bottom of ribcage 2. RADIATION: Does the pain go anywhere else? (e.g., into neck, jaw, arms, back)     Back 3. ONSET: When did the chest pain begin? (Minutes, hours or days)      Tuesday, worsened Wednesday  4. PATTERN: Does the pain come and go, or has it been constant since it started?  Does it get worse with exertion?      Chest tightness comes and goes, pain in lungs is mostly constant, but is relieved by use of inhalers 5. DURATION: How long does it last (e.g., seconds, minutes, hours)     It depends, longer than 5 minutes at times 7. CARDIAC RISK FACTORS: Do you have any history of heart problems or risk factors for heart disease? (e.g., angina, prior heart attack; diabetes, high blood pressure, high cholesterol, smoker, or strong family history of heart disease)     Denies 8. PULMONARY RISK FACTORS: Do you have any history of lung disease?  (e.g., blood clots in lung, asthma, emphysema, birth control pills)     Asthma 9. CAUSE: What do you think is causing the chest pain?     Unsure, states she was exposed to strep 2 weeks ago, experienced symptoms and never sought out treatment 10. OTHER SYMPTOMS: Do you have any other symptoms? (e.g., dizziness, nausea, vomiting, sweating, fever, difficulty breathing, cough)      Occasional irritated  cough Denies SOB at this time (recently used inhaler), denies fever    This RN advised ED. Patient declined and stated she would like to be seen in the office for this. Please advise.   Copied from CRM [id number]. Topic: Clinical - Red Word Triage >> Oct 30, 2024 10:48 AM Revonda D wrote: Red Word that prompted transfer to Nurse Triage: pain   Pt stated that she is experiencing pain in her ribs and lungs. She stated the the pain stops temporarily when she uses her inhaler but stated its not an asthma attack that's causing this. Pt would like to get an appt scheduled with her PCP. Reason for Disposition  Patient sounds very sick or weak to the triager  Protocols used: Chest Pain-A-AH

## 2024-10-30 NOTE — Telephone Encounter (Signed)
 Please review and advise.

## 2024-10-30 NOTE — Telephone Encounter (Signed)
 This RN called [facility] to notify of ED refusal.

## 2024-11-01 ENCOUNTER — Ambulatory Visit (HOSPITAL_COMMUNITY)

## 2024-11-02 ENCOUNTER — Other Ambulatory Visit: Payer: Self-pay | Admitting: Family Medicine

## 2024-11-25 ENCOUNTER — Ambulatory Visit: Admitting: Family Medicine

## 2024-11-25 ENCOUNTER — Encounter: Payer: Self-pay | Admitting: Family Medicine

## 2024-11-25 ENCOUNTER — Ambulatory Visit

## 2024-11-25 VITALS — BP 136/86 | HR 75 | Temp 97.7°F | Ht 63.0 in | Wt 171.0 lb

## 2024-11-25 DIAGNOSIS — M546 Pain in thoracic spine: Secondary | ICD-10-CM | POA: Diagnosis not present

## 2024-11-25 DIAGNOSIS — E890 Postprocedural hypothyroidism: Secondary | ICD-10-CM

## 2024-11-25 DIAGNOSIS — E669 Obesity, unspecified: Secondary | ICD-10-CM

## 2024-11-25 DIAGNOSIS — Z8639 Personal history of other endocrine, nutritional and metabolic disease: Secondary | ICD-10-CM

## 2024-11-25 DIAGNOSIS — R7301 Impaired fasting glucose: Secondary | ICD-10-CM

## 2024-11-25 DIAGNOSIS — R0789 Other chest pain: Secondary | ICD-10-CM

## 2024-11-25 NOTE — Patient Instructions (Signed)
 Please return in 2 weeks to go over results and discuss weight loss  If you have any questions or concerns, please don't hesitate to send me a message via MyChart or call the office at [phone]. Thank you for visiting with us  today! It's our pleasure caring for you.

## 2024-11-26 LAB — CBC WITH DIFFERENTIAL/PLATELET
Basophils Absolute: 0.1 10*3/uL (ref 0.0–0.1)
Basophils Relative: 1.1 % (ref 0.0–3.0)
Eosinophils Absolute: 0.2 10*3/uL (ref 0.0–0.7)
Eosinophils Relative: 2.7 % (ref 0.0–5.0)
HCT: 42.8 % (ref 36.0–46.0)
Hemoglobin: 14.4 g/dL (ref 12.0–15.0)
Lymphocytes Relative: 26.7 % (ref 12.0–46.0)
Lymphs Abs: 1.6 10*3/uL (ref 0.7–4.0)
MCHC: 33.7 g/dL (ref 30.0–36.0)
MCV: 84.9 fl (ref 78.0–100.0)
Monocytes Absolute: 0.5 10*3/uL (ref 0.1–1.0)
Monocytes Relative: 7.5 % (ref 3.0–12.0)
Neutro Abs: 3.7 10*3/uL (ref 1.4–7.7)
Neutrophils Relative %: 62 % (ref 43.0–77.0)
Platelets: 332 10*3/uL (ref 150.0–400.0)
RBC: 5.04 Mil/uL (ref 3.87–5.11)
RDW: 12.9 % (ref 11.5–15.5)
WBC: 6 10*3/uL (ref 4.0–10.5)

## 2024-11-26 LAB — TSH: TSH: 1.51 u[IU]/mL (ref 0.35–5.50)

## 2024-11-26 LAB — COMPREHENSIVE METABOLIC PANEL WITH GFR
ALT: 33 U/L (ref 3–35)
AST: 28 U/L (ref 5–37)
Albumin: 4.8 g/dL (ref 3.5–5.2)
Alkaline Phosphatase: 87 U/L (ref 39–117)
BUN: 14 mg/dL (ref 6–23)
CO2: 32 meq/L (ref 19–32)
Calcium: 10.4 mg/dL (ref 8.4–10.5)
Chloride: 101 meq/L (ref 96–112)
Creatinine, Ser: 0.77 mg/dL (ref 0.40–1.20)
GFR: 87.13 mL/min
Glucose, Bld: 97 mg/dL (ref 70–99)
Potassium: 4.1 meq/L (ref 3.5–5.1)
Sodium: 139 meq/L (ref 135–145)
Total Bilirubin: 0.4 mg/dL (ref 0.2–1.2)
Total Protein: 7.6 g/dL (ref 6.0–8.3)

## 2024-11-26 LAB — LIPID PANEL
Cholesterol: 247 mg/dL — ABNORMAL HIGH (ref 28–200)
HDL: 44.5 mg/dL
LDL Cholesterol: 149 mg/dL — ABNORMAL HIGH (ref 10–99)
NonHDL: 202.16
Total CHOL/HDL Ratio: 6
Triglycerides: 266 mg/dL — ABNORMAL HIGH (ref 10.0–149.0)
VLDL: 53.2 mg/dL — ABNORMAL HIGH (ref 0.0–40.0)

## 2024-11-26 LAB — HEMOGLOBIN A1C: Hgb A1c MFr Bld: 5.7 % (ref 4.6–6.5)

## 2024-11-26 NOTE — Progress Notes (Signed)
 "  Subjective  CC:  Chief Complaint  Patient presents with   Back Pain    Pt is having some mid back pain off/on for the past 2 mos and has gotten worse   Weight Loss    Pt is going to the gym 3/4 x a week and she feels like she is not losing any weight.    HPI: Melanie Travis is a 54 y.o. female who presents to the office today to address the problems listed above in the chief complaint. Discussed the use of AI scribe software for clinical note transcription with the patient, who gave verbal consent to proceed.  History of Present Illness Melanie Travis is a 54 year old female who presents with back discomfort and decreased exercise tolerance.  Inter-scapular discomfort - Discomfort located between the shoulder blades, described as 'puffy' or 'heavy' sensation - Frequency of discomfort has increased over the past month - Symptoms occur both at rest (e.g., sitting at desk) and are not consistently triggered by exercise - Inhaler provides inconsistent relief for tightness, h/o mild asthma.  - no injury. - h/o lumbar DJD  Decreased exercise tolerance - Exercise tolerance has declined over the past few weeks - Feels more winded during workouts and when climbing stairs, especially when carrying items such as a briefcase, but not with each work out. Some workouts are fine. - Exercises three to four times per week, typically using the elliptical and circuit training for approximately thirty minutes - No chest pain or shortness of breath during exercise - Has not lost weight but notes changes in body composition, such as being able to button a previously tight vest  H/o graves disease s/p ablation on levothyroxine . No palpitations, edema, heat or cold intolerance.   Has heaviness in the left chest; again intermittent w/o clear pattern. No h/o gerd. Diet is good overall. Worries about heart disease presenting atypically. No association with mvt or position. Sxs are fleeting.     Assessment  1. Chest tightness   2. Acute midline thoracic back pain   3. IFG (impaired fasting glucose)   4. History of Graves' disease   5. Postablative hypothyroidism   6. Obesity (BMI 30-39.9)      Plan  Assessment and Plan Assessment & Plan Evaluation of chest tightness and thoracic back discomfort Intermittent chest tightness and thoracic back discomfort over the past 2-3 weeks, with increased frequency in the last month. Symptoms are not consistently related to exertion or specific activities. No associated chest pain or significant shortness of breath. Differential diagnosis includes cardiac etiology, anxiety, or musculoskeletal issues such as arthritis. Family history of heart problems raises concern for cardiac causes. Symptoms are variable and not consistently reproducible, complicating diagnosis. - Ordered EKG to assess cardiac function- no acute changes, no comparison - Ordered blood work to evaluate for underlying causes - Patient to monitor symptoms.  Recommend ER for acute chest pain.  If symptoms persist or progress, recommend stress testing.  Patient agrees. - Cover for reflux symptoms with Prilosec 20 mg daily.  Monitor for improvement.  Postablative hypothyroidism Recheck thyroid  function to ensure symptoms are not related to uncontrolled hypothyroidism.  Back pain: Possibly musculoskeletal.  Check thoracic spine x-rays.  Tylenol or Advil.  Obesity Management is complicated by recent lifestyle changes, including reduced exercise frequency and increased sugar intake. Previous weight loss efforts showed some improvement in body composition but not significant weight reduction. Discussion of GLP-1 medications for weight loss, which do not address  hormonal imbalances. Emphasis on the need to address current chest and back symptoms before focusing on weight loss. - Discussed potential use of GLP-1 medications for weight loss, patient will return in 2 weeks to go over  lab work and discuss further  I personally spent a total of 52 minutes in the care of the patient today including preparing to see the patient, getting/reviewing separately obtained history, performing a medically appropriate exam/evaluation, counseling and educating, placing orders, documenting clinical information in the EHR, independently interpreting results, and communicating results.   Follow up: 2 weeks Orders Placed This Encounter  Procedures   DG Thoracic Spine 2 View   CBC with Differential/Platelet   Comprehensive metabolic panel with GFR   Lipid panel   Hemoglobin A1c   TSH   EKG 12-Lead   No orders of the defined types were placed in this encounter.    I reviewed the patients updated PMH, FH, and SocHx.  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 Family History: Patient family history includes Allergies in her father; Celiac disease in her mother. Social History:  Patient  reports that she quit smoking about 6 years ago. Her smoking use included cigarettes. She has never used smokeless tobacco. She reports current alcohol use of about 2.0 - 4.0 standard drinks of alcohol per week. She reports that she does not use drugs.  Review of Systems: Constitutional: Negative for fever malaise or anorexia Cardiovascular: negative for chest pain Respiratory: negative for SOB or persistent  cough Gastrointestinal: negative for abdominal pain  Objective  Vitals: BP 136/86   Pulse 75   Temp 97.7 F (36.5 C)   Ht 5' 3 (1.6 m)   Wt 171 lb (77.6 kg)   SpO2 99%   BMI 30.29 kg/m  General: no acute distress , A&Ox3 HEENT: PEERL, conjunctiva normal, neck is supple Cardiovascular:  RRR without murmur or gallop.  No chest wall tenderness Respiratory:  Good breath sounds bilaterally, CTAB with normal respiratory effort No edema Skin:  Warm, no rashes Commons side effects, risks, benefits, and alternatives for medications and treatment plan prescribed today were discussed, and the patient expressed understanding of the given instructions. Patient is instructed to call or message via MyChart if he/she has any questions or concerns regarding our treatment plan. No barriers to understanding were identified. We discussed Red Flag symptoms and signs in detail. Patient expressed understanding regarding what to do in case of urgent or emergency type symptoms.  Medication list was reconciled, printed and provided to the patient in AVS. Patient instructions and summary information was reviewed with the patient as documented in the AVS. This note was prepared with assistance of Dragon voice recognition software. Occasional wrong-word or sound-a-like substitutions may have occurred due to the inherent limitations of voice recognition software    [1]  Current Meds  Medication Sig   albuterol  (VENTOLIN  HFA) 108 (90 Base) MCG/ACT inhaler INHALE 2 PUFFS INTO THE LUNGS EVERY 6 HOURS AS NEEDED FOR WHEEZING OR SHORTNESS OF  BREATH   levothyroxine  (SYNTHROID ) 150 MCG tablet TAKE 1 TABLET(150 MCG) BY MOUTH DAILY   loratadine (CLARITIN) 10 MG tablet Take 10 mg by mouth daily.   "

## 2024-11-28 ENCOUNTER — Ambulatory Visit: Payer: Self-pay | Admitting: Family Medicine

## 2024-11-28 NOTE — Progress Notes (Signed)
 Labs reviewed.  The 10-year ASCVD risk score (Arnett DK, et al., 2019) is: 3.2%   Values used to calculate the score:     Age: 54 years     Clinically relevant sex: Female     Is Non-Hispanic African American: No     Diabetic: No     Tobacco smoker: No     Systolic Blood Pressure: 136 mmHg     Is BP treated: No     HDL Cholesterol: 44.5 mg/dL     Total Cholesterol: 247 mg/dL

## 2024-11-30 NOTE — Progress Notes (Signed)
 See mychart note Dear Ms. Mashaw, Your upper back xray shows mild osteoarthritic changes called spondylosis. No other findings are remarkable. This could be contributing to your symptoms, but as we discussed, if things persist or worsen, we will need more of a work up.  Sincerely, Dr. Jodie

## 2024-12-09 ENCOUNTER — Ambulatory Visit: Admitting: Family Medicine

## 2024-12-30 ENCOUNTER — Encounter: Payer: Federal, State, Local not specified - PPO | Admitting: Family Medicine
